# Patient Record
Sex: Male | Born: 1950
Health system: Southern US, Community
[De-identification: ages and names within clinical notes are randomized; demographics above are authoritative.]

## PROBLEM LIST (undated history)

## (undated) DIAGNOSIS — M4300 Spondylolysis, site unspecified: Secondary | ICD-10-CM

## (undated) DIAGNOSIS — I10 Essential (primary) hypertension: Secondary | ICD-10-CM

## (undated) DIAGNOSIS — C801 Malignant (primary) neoplasm, unspecified: Secondary | ICD-10-CM

## (undated) DIAGNOSIS — M1990 Unspecified osteoarthritis, unspecified site: Secondary | ICD-10-CM

## (undated) HISTORY — PX: COLONOSCOPY: SHX174

## (undated) HISTORY — PX: PROSTATECTOMY: SHX69

## (undated) HISTORY — PX: TONSILLECTOMY: SUR1361

---

## 1983-07-13 HISTORY — PX: KNEE ARTHROSCOPY: SUR90

## 2000-10-26 ENCOUNTER — Encounter: Payer: Self-pay | Admitting: Internal Medicine

## 2000-10-26 ENCOUNTER — Ambulatory Visit (HOSPITAL_COMMUNITY): Admission: RE | Admit: 2000-10-26 | Discharge: 2000-10-26 | Payer: Self-pay | Admitting: Internal Medicine

## 2001-01-31 ENCOUNTER — Ambulatory Visit (HOSPITAL_COMMUNITY): Admission: RE | Admit: 2001-01-31 | Discharge: 2001-01-31 | Payer: Self-pay | Admitting: *Deleted

## 2001-01-31 ENCOUNTER — Encounter: Payer: Self-pay | Admitting: *Deleted

## 2001-09-26 ENCOUNTER — Other Ambulatory Visit: Admission: RE | Admit: 2001-09-26 | Discharge: 2001-09-26 | Payer: Self-pay | Admitting: General Surgery

## 2003-04-03 ENCOUNTER — Encounter: Payer: Self-pay | Admitting: Family Medicine

## 2003-04-03 ENCOUNTER — Ambulatory Visit (HOSPITAL_COMMUNITY): Admission: RE | Admit: 2003-04-03 | Discharge: 2003-04-03 | Payer: Self-pay | Admitting: Family Medicine

## 2003-05-08 ENCOUNTER — Ambulatory Visit (HOSPITAL_COMMUNITY): Admission: RE | Admit: 2003-05-08 | Discharge: 2003-05-08 | Payer: Self-pay | Admitting: Internal Medicine

## 2004-12-08 ENCOUNTER — Ambulatory Visit (HOSPITAL_COMMUNITY): Admission: RE | Admit: 2004-12-08 | Discharge: 2004-12-08 | Payer: Self-pay | Admitting: Family Medicine

## 2005-01-20 ENCOUNTER — Ambulatory Visit (HOSPITAL_COMMUNITY): Admission: RE | Admit: 2005-01-20 | Discharge: 2005-01-20 | Payer: Self-pay | Admitting: Urology

## 2005-02-23 ENCOUNTER — Inpatient Hospital Stay (HOSPITAL_COMMUNITY): Admission: RE | Admit: 2005-02-23 | Discharge: 2005-02-26 | Payer: Self-pay | Admitting: Urology

## 2010-08-02 ENCOUNTER — Encounter: Payer: Self-pay | Admitting: Urology

## 2011-07-19 ENCOUNTER — Encounter (HOSPITAL_COMMUNITY): Payer: Self-pay | Admitting: Pharmacy Technician

## 2011-07-21 ENCOUNTER — Other Ambulatory Visit: Payer: Self-pay

## 2011-07-21 ENCOUNTER — Encounter (HOSPITAL_COMMUNITY)
Admission: RE | Admit: 2011-07-21 | Discharge: 2011-07-21 | Disposition: A | Discharge status: Home / Self Care | Payer: BC Managed Care – PPO | Source: Ambulatory Visit | Attending: Ophthalmology | Admitting: Ophthalmology

## 2011-07-21 ENCOUNTER — Encounter (HOSPITAL_COMMUNITY): Payer: Self-pay

## 2011-07-21 HISTORY — DX: Malignant (primary) neoplasm, unspecified: C80.1

## 2011-07-21 HISTORY — DX: Spondylolysis, site unspecified: M43.00

## 2011-07-21 HISTORY — DX: Essential (primary) hypertension: I10

## 2011-07-21 HISTORY — DX: Unspecified osteoarthritis, unspecified site: M19.90

## 2011-07-21 LAB — BASIC METABOLIC PANEL
BUN: 13 mg/dL (ref 6–23)
CO2: 28 mEq/L (ref 19–32)
Chloride: 105 mEq/L (ref 96–112)
GFR calc non Af Amer: >90 mL/min (ref >90)
Glucose, Bld: 90 mg/dL (ref 70–99)
Potassium: 4.1 mEq/L (ref 3.5–5.1)

## 2011-07-21 NOTE — Patient Instructions (Addendum)
20 ACEA YAGI  07/21/2011   Your procedure is scheduled on:   07/26/2011  Report to [facility] at  700  AM.  Call this number if you have problems the morning of surgery: [phone]   Remember:   Do not eat food:After Midnight.  May have clear liquids:until Midnight .  Clear liquids include soda, tea, black coffee, apple or grape juice, broth.  Take these medicines the morning of surgery with A SIP OF WATER: lorcet,lotrel   Do not wear jewelry, make-up or nail polish.  Do not wear lotions, powders, or perfumes. You may wear deodorant.  Do not shave 48 hours prior to surgery.  Do not bring valuables to the hospital.  Contacts, dentures or bridgework may not be worn into surgery.  Leave suitcase in the car. After surgery it may be brought to your room.  For patients admitted to the hospital, checkout time is 11:00 AM the day of discharge.   Patients discharged the day of surgery will not be allowed to drive home.  Name and phone number of your driver: family  Special Instructions: N/A   Please read over the following fact sheets that you were given: Pain Booklet, Surgical Site Infection Prevention, Anesthesia Post-op Instructions and Care and Recovery After Surgery Cataract A cataract is a clouding of the lens of the eye. It is most often related to aging. A cataract is not a "film" over the surface of the eye. The lens is inside the eye and changes size of the pupil. The lens can enlarge to let more light enter the eye in dark environments and contract the size of the pupil to let in bright light. The lens is the part of the eye that helps focus light on the retina. The retina is the eye's light-sensitive layer. It is in the back of the eye that sends visual signals to the brain. In a normal eye, light passes through the lens and gets focused on the retina. To help produce a sharp image, the lens must remain clear. When a lens becomes cloudy, vision is compromised by the degree and nature  of the clouding. Certain cataracts make people more near-sighted as they develop, others increase glare, and all reduce vision to some degree or another. A cataract that is so dense that it becomes milky white and a white opacity can be seen through the pupil. When the white color is seen, it is called a "mature" or "hyper-mature cataract." Such cataracts cause total blindness in the affected eye. The cataract must be removed to prevent damage to the eye itself. Some types of cataracts can cause a secondary disease of the eye, such as certain types of glaucoma. In the early stages, better lighting and eyeglasses may lessen vision problems caused by cataracts. At a certain point, surgery may be needed to improve vision. CAUSES   Aging. However, cataracts may occur at any age, even in newborns.   Certain drugs.   Trauma to the eye.   Certain diseases (such as diabetes).   Inherited or acquired medical syndromes.  SYMPTOMS   Gradual, progressive drop in vision in the affected eye. Cataracts may develop at different rates in each eye. Cataracts may even be in just one eye with the other unaffected.   Cataracts due to trauma may develop quickly, sometimes over a matter or days or even hours. The result is severe and rapid visual loss.  DIAGNOSIS  To detect a cataract, an eye doctor examines the  lens. A well developed cataract can be diagnosed without dilating the pupil. Early cataracts and others of a specific nature are best diagnosed with an exam of the eyes with the pupils dilated by drops. TREATMENT   For an early cataract, vision may improve by using different eyeglasses or stronger lighting.   If the above measures do not help, surgery is the only effective treatment. This treatment removes the cloudy lens and replaces it with a substitute lens (Intraocular lens, or IOL). Newly developed IOL technology allows the implanted lens to improve vision both at a distance and up close. Discuss with  your eye surgeon about the possibility of still needing glasses. Also discuss how visual coordination between both eyes will be affected.  A cataract needs to be removed only when vision loss interferes with your everyday activities such as driving, reading or watching TV. You and your eye doctor can make that decision together. In most cases, waiting until you are ready to have cataract surgery will not harm your eye. If you have cataracts in both eyes, only one should be removed at a time. This allows the operated eye to heal and be out of danger from serious problems (such as infection or poor wound healing) before having the other eye undergo surgery.  Sometimes, a cataract should be removed even if it does not cause problems with your vision. For example, a cataract should be removed if it prevents examination or treatment of another eye problem. Just as you cannot see out of the affected eye well, your doctor cannot see into your eye well through a cataract. The vast majority of people who have cataract surgery have better vision afterward. CATARACT REMOVAL There are two primary ways to remove a cataract. Your doctor can explain the differences and help determine which is best for you:  Phacoemulsification (small incision cataract surgery). This involves making a small cut (incision) on the edge of the clear, dome-shaped surface that covers the front of the eye (the cornea). An injection behind the eye or eye drops are given to make this a painless procedure. The doctor then inserts a tiny probe into the eye. This device emits ultrasound waves that soften and break up the cloudy center of the lens so it can be removed by suction. Most cataract surgery is done this way. The cuts are usually so small and performed in such a manner that often no sutures are needed to keep it closed.   Extracapsular surgery. Your doctor makes a slightly longer incision on the side of the cornea. The doctor removes the  hard center of the lens. The remainder of the lens is then removed by suction. In some cases, extremely fine sutures are needed which the doctor may, or may not remove in the office after the surgery.  When an IOL is implanted, it needs no care. It becomes a permanent part of your eye and cannot be seen or felt.  Some people cannot have an IOL. They may have problems during surgery, or maybe they have another eye disease. For these people, a soft contact lens may be suggested. If an IOL or contact lens cannot be used, very powerful and thick glasses are required after surgery. Since vision is very different through such thick glasses, it is important to have your doctor discuss the impact on your vision after any cataract surgery where there is no plan to implant an IOL. The normal lens of the eye is covered by a clear capsule.  Both phacoemulsification and extracapsular surgery require that the back surface of this lens capsule be left in place. This helps support IOLs and prevents the IOL from dislocating and falling back into the deeper interior of the eye. Right after surgery, and often permanently this "posterior capsule" remains clear. In some cases however, it can become cloudy, presenting the same type of visual compromise that the original cataract did since light is again obstructed as it passes through the clear IOL. This condition is often referred to as an "after-cataract." Fortunately, after-cataracts are easily treated using a painless and very fast laser treatment that is performed without anesthesia or incisions. It is done in a matter of minutes in an outpatient environment. Visual improvement is often immediate.  HOME CARE INSTRUCTIONS   Your surgeon will discuss pre and post operative care with you prior to surgery. The majority of people are able to do almost all normal activities right away. Although, it is often advised to avoid strenuous activity for a period of time.   Postoperative  drops and careful avoidance of infection will be needed. Many surgeons suggest the use of a protective shield during the first few days after surgery.   There is a very small incidence of complication from modern cataract surgery, but it can happen. Infection that spreads to the inside of the eye (endophthalmitis) can result in total visual loss and even loss of the eye itself. In extremely rare instances, the inflammation of endophthalmitis can spread to both eyes (sympathetic ophthalmia). Appropriate post-operative care under the close observation of your surgeon is essential to a successful outcome.  SEEK IMMEDIATE MEDICAL CARE IF:   You have any sudden drop of vision in the operated eye.   You have pain in the operated eye.   You see a large number of floating dots in the field of vision in the operated eye.   You see flashing lights, or if a portion of your side vision in any direction appears black (like a curtain being drawn into your field of vision) in the operated eye.  Document Released: 06/28/2005 Document Revised: 03/10/2011 Document Reviewed: 08/14/2007 [facility] Patient Information 2012 [place], [place].PATIENT INSTRUCTIONS POST-ANESTHESIA  IMMEDIATELY FOLLOWING SURGERY:  Do not drive or operate machinery for the first twenty four hours after surgery.  Do not make any important decisions for twenty four hours after surgery or while taking narcotic pain medications or sedatives.  If you develop intractable nausea and vomiting or a severe headache please notify your doctor immediately.  FOLLOW-UP:  Please make an appointment with your surgeon as instructed. You do not need to follow up with anesthesia unless specifically instructed to do so.  WOUND CARE INSTRUCTIONS (if applicable):  Keep a dry clean dressing on the anesthesia/puncture wound site if there is drainage.  Once the wound has quit draining you may leave it open to air.  Generally you should leave the bandage intact for  twenty four hours unless there is drainage.  If the epidural site drains for more than 36-48 hours please call the anesthesia department.  QUESTIONS?:  Please feel free to call your physician or the hospital operator if you have any questions, and they will be happy to assist you.     [facility] Anesthesia Department [address] [place] [phone]

## 2011-07-23 ENCOUNTER — Encounter (HOSPITAL_COMMUNITY): Payer: Self-pay | Admitting: *Deleted

## 2011-07-26 ENCOUNTER — Encounter (HOSPITAL_COMMUNITY): Payer: Self-pay | Admitting: *Deleted

## 2011-07-26 ENCOUNTER — Ambulatory Visit (HOSPITAL_COMMUNITY): Payer: BC Managed Care – PPO | Admitting: Anesthesiology

## 2011-07-26 ENCOUNTER — Encounter (HOSPITAL_COMMUNITY)
Admission: RE | Disposition: A | Discharge status: Home / Self Care | Payer: Self-pay | Source: Ambulatory Visit | Attending: Ophthalmology

## 2011-07-26 ENCOUNTER — Ambulatory Visit (HOSPITAL_COMMUNITY)
Admission: RE | Admit: 2011-07-26 | Discharge: 2011-07-26 | Disposition: A | Discharge status: Home / Self Care | Payer: BC Managed Care – PPO | Source: Ambulatory Visit | Attending: Ophthalmology | Admitting: Ophthalmology

## 2011-07-26 ENCOUNTER — Encounter (HOSPITAL_COMMUNITY): Payer: Self-pay | Admitting: Anesthesiology

## 2011-07-26 DIAGNOSIS — Z79899 Other long term (current) drug therapy: Secondary | ICD-10-CM | POA: Insufficient documentation

## 2011-07-26 DIAGNOSIS — Z01810 Encounter for preprocedural cardiovascular examination: Secondary | ICD-10-CM | POA: Insufficient documentation

## 2011-07-26 DIAGNOSIS — I10 Essential (primary) hypertension: Secondary | ICD-10-CM | POA: Insufficient documentation

## 2011-07-26 DIAGNOSIS — IMO0002 Reserved for concepts with insufficient information to code with codable children: Secondary | ICD-10-CM | POA: Insufficient documentation

## 2011-07-26 DIAGNOSIS — Z01812 Encounter for preprocedural laboratory examination: Secondary | ICD-10-CM | POA: Insufficient documentation

## 2011-07-26 HISTORY — PX: CATARACT EXTRACTION W/PHACO: SHX586

## 2011-07-26 SURGERY — PHACOEMULSIFICATION, CATARACT, WITH IOL INSERTION
Anesthesia: Monitor Anesthesia Care | Site: Eye | Laterality: Right | Wound class: Clean

## 2011-07-26 MED ORDER — PHENYLEPHRINE HCL 2.5 % OP SOLN
1.0000 [drp] | OPHTHALMIC | Status: AC
  Administered 2011-07-26 (×3): 1 [drp] via OPHTHALMIC

## 2011-07-26 MED ORDER — MIDAZOLAM HCL 2 MG/2ML IJ SOLN
1.0000 mg | INTRAMUSCULAR | Status: DC | PRN
  Administered 2011-07-26: 2 mg via INTRAVENOUS

## 2011-07-26 MED ORDER — EPINEPHRINE HCL 1 MG/ML IJ SOLN
INTRAOCULAR | Status: DC | PRN
  Administered 2011-07-26: 09:00:00

## 2011-07-26 MED ORDER — BSS IO SOLN
INTRAOCULAR | Status: DC | PRN
  Administered 2011-07-26: 15 mL via INTRAOCULAR

## 2011-07-26 MED ORDER — LIDOCAINE HCL (PF) 1 % IJ SOLN
INTRAMUSCULAR | Status: DC | PRN
  Administered 2011-07-26: .3 mL

## 2011-07-26 MED ORDER — EPINEPHRINE HCL 1 MG/ML IJ SOLN
INTRAMUSCULAR | Status: AC
  Filled 2011-07-26: qty 1

## 2011-07-26 MED ORDER — LIDOCAINE HCL (PF) 1 % IJ SOLN
INTRAMUSCULAR | Status: AC
  Filled 2011-07-26: qty 2

## 2011-07-26 MED ORDER — NEOMYCIN-POLYMYXIN-DEXAMETH 3.5-10000-0.1 OP OINT
TOPICAL_OINTMENT | OPHTHALMIC | Status: AC
  Filled 2011-07-26: qty 3.5

## 2011-07-26 MED ORDER — PROVISC 10 MG/ML IO SOLN
INTRAOCULAR | Status: DC | PRN
  Administered 2011-07-26: 8.5 mg via INTRAOCULAR

## 2011-07-26 MED ORDER — FENTANYL CITRATE 0.05 MG/ML IJ SOLN: 25.0000 ug | INTRAMUSCULAR | Status: DC | PRN

## 2011-07-26 MED ORDER — LIDOCAINE 3.5 % OP GEL OPTIME - NO CHARGE
OPHTHALMIC | Status: DC | PRN
  Administered 2011-07-26: 1 [drp] via OPHTHALMIC

## 2011-07-26 MED ORDER — MIDAZOLAM HCL 2 MG/2ML IJ SOLN
INTRAMUSCULAR | Status: AC
  Administered 2011-07-26: 2 mg via INTRAVENOUS
  Filled 2011-07-26: qty 2

## 2011-07-26 MED ORDER — CYCLOPENTOLATE-PHENYLEPHRINE 0.2-1 % OP SOLN
OPHTHALMIC | Status: AC
  Administered 2011-07-26: 1 [drp] via OPHTHALMIC
  Filled 2011-07-26: qty 2

## 2011-07-26 MED ORDER — CYCLOPENTOLATE-PHENYLEPHRINE 0.2-1 % OP SOLN
1.0000 [drp] | OPHTHALMIC | Status: AC
  Administered 2011-07-26 (×3): 1 [drp] via OPHTHALMIC

## 2011-07-26 MED ORDER — TETRACAINE HCL 0.5 % OP SOLN
OPHTHALMIC | Status: AC
  Administered 2011-07-26: 1 [drp] via OPHTHALMIC
  Filled 2011-07-26: qty 2

## 2011-07-26 MED ORDER — ONDANSETRON HCL 4 MG/2ML IJ SOLN: 4.0000 mg | Freq: Once | INTRAMUSCULAR | Status: DC | PRN

## 2011-07-26 MED ORDER — POVIDONE-IODINE 5 % OP SOLN
OPHTHALMIC | Status: DC | PRN
  Administered 2011-07-26: 1 via OPHTHALMIC

## 2011-07-26 MED ORDER — LIDOCAINE HCL 3.5 % OP GEL
OPHTHALMIC | Status: AC
  Administered 2011-07-26: 1 via OPHTHALMIC
  Filled 2011-07-26: qty 5

## 2011-07-26 MED ORDER — LIDOCAINE HCL 3.5 % OP GEL
1.0000 "application " | Freq: Once | OPHTHALMIC | Status: AC
  Administered 2011-07-26: 1 via OPHTHALMIC

## 2011-07-26 MED ORDER — TETRACAINE HCL 0.5 % OP SOLN
1.0000 [drp] | OPHTHALMIC | Status: AC
  Administered 2011-07-26 (×3): 1 [drp] via OPHTHALMIC

## 2011-07-26 MED ORDER — NEOMYCIN-POLYMYXIN-DEXAMETH 0.1 % OP OINT
TOPICAL_OINTMENT | OPHTHALMIC | Status: DC | PRN
  Administered 2011-07-26: 1 via OPHTHALMIC

## 2011-07-26 MED ORDER — LACTATED RINGERS IV SOLN
INTRAVENOUS | Status: DC
  Administered 2011-07-26: 1000 mL via INTRAVENOUS

## 2011-07-26 MED ORDER — PHENYLEPHRINE HCL 2.5 % OP SOLN
OPHTHALMIC | Status: AC
  Administered 2011-07-26: 1 [drp] via OPHTHALMIC
  Filled 2011-07-26: qty 2

## 2011-07-26 SURGICAL SUPPLY — 30 items
CAPSULAR TENSION RING-AMO (OPHTHALMIC RELATED) IMPLANT
CLOTH BEACON ORANGE TIMEOUT ST (SAFETY) ×2 IMPLANT
EYE SHIELD UNIVERSAL CLEAR (GAUZE/BANDAGES/DRESSINGS) ×2 IMPLANT
GLOVE BIO SURGEON STRL SZ 6.5 (GLOVE) IMPLANT
GLOVE BIOGEL PI IND STRL 6.5 (GLOVE) ×1 IMPLANT
GLOVE BIOGEL PI IND STRL 7.0 (GLOVE) IMPLANT
GLOVE BIOGEL PI IND STRL 7.5 (GLOVE) IMPLANT
GLOVE BIOGEL PI INDICATOR 6.5 (GLOVE) ×1
GLOVE BIOGEL PI INDICATOR 7.0 (GLOVE)
GLOVE BIOGEL PI INDICATOR 7.5 (GLOVE)
GLOVE ECLIPSE 6.5 STRL STRAW (GLOVE) IMPLANT
GLOVE ECLIPSE 7.0 STRL STRAW (GLOVE) IMPLANT
GLOVE ECLIPSE 7.5 STRL STRAW (GLOVE) IMPLANT
GLOVE EXAM NITRILE LRG STRL (GLOVE) IMPLANT
GLOVE EXAM NITRILE MD LF STRL (GLOVE) ×2 IMPLANT
GLOVE SKINSENSE NS SZ6.5 (GLOVE)
GLOVE SKINSENSE NS SZ7.0 (GLOVE)
GLOVE SKINSENSE STRL SZ6.5 (GLOVE) IMPLANT
GLOVE SKINSENSE STRL SZ7.0 (GLOVE) IMPLANT
KIT VITRECTOMY (OPHTHALMIC RELATED) IMPLANT
PAD ARMBOARD 7.5X6 YLW CONV (MISCELLANEOUS) ×2 IMPLANT
PROC W NO LENS (INTRAOCULAR LENS)
PROC W SPEC LENS (INTRAOCULAR LENS)
PROCESS W NO LENS (INTRAOCULAR LENS) IMPLANT
PROCESS W SPEC LENS (INTRAOCULAR LENS) IMPLANT
RING MALYGIN (MISCELLANEOUS) IMPLANT
SIGHTPATH CAT PROC W REG LENS (Ophthalmic Related) ×2 IMPLANT
SYR TB 1ML LL NO SAFETY (SYRINGE) ×2 IMPLANT
VISCOELASTIC ADDITIONAL (OPHTHALMIC RELATED) IMPLANT
WATER STERILE IRR 250ML POUR (IV SOLUTION) ×2 IMPLANT

## 2011-07-26 NOTE — H&P (Signed)
I have reviewed the H&P, the patient was re-examined, and I have identified no interval changes in medical condition and plan of care since the history and physical of record  

## 2011-07-26 NOTE — Anesthesia Postprocedure Evaluation (Signed)
  Anesthesia Post-op Note  Patient: Angel Lewis  Procedure(s) Performed:  CATARACT EXTRACTION PHACO AND INTRAOCULAR LENS PLACEMENT (IOC) - CDE:10.88  Patient Location: PACU and Short Stay  Anesthesia Type: MAC  Level of Consciousness: awake, alert , oriented and patient cooperative  Airway and Oxygen Therapy: Patient Spontanous Breathing  Post-op Pain: none  Post-op Assessment: Post-op Vital signs reviewed, Patient's Cardiovascular Status Stable, Respiratory Function Stable, Patent Airway and No signs of Nausea or vomiting  Post-op Vital Signs: Reviewed and stable  Complications: No apparent anesthesia complications

## 2011-07-26 NOTE — Brief Op Note (Signed)
Pre-Op Dx: Cataract OD Post-Op Dx: Cataract OD Surgeon: Corayma Cashatt Anesthesia: Topical with MAC Implant: B&L enVista Blood Loss: None Specimen: None Complications: None 

## 2011-07-26 NOTE — Anesthesia Preprocedure Evaluation (Signed)
Anesthesia Evaluation  Patient identified by MRN, date of birth, ID band Patient awake    Reviewed: Allergy & Precautions, H&P , NPO status , Patient's Chart, lab work & pertinent test results  History of Anesthesia Complications Negative for: history of anesthetic complications  Airway Mallampati: II      Dental  (+) Teeth Intact   Pulmonary Current Smoker,  + rhonchi        Cardiovascular hypertension, Pt. on medications Regular Normal    Neuro/Psych    GI/Hepatic   Endo/Other    Renal/GU      Musculoskeletal  (+) Arthritis - (ankylosing spondylitis),   Abdominal   Peds  Hematology   Anesthesia Other Findings   Reproductive/Obstetrics                           Anesthesia Physical Anesthesia Plan  ASA: III  Anesthesia Plan: MAC   Post-op Pain Management:    Induction: Intravenous  Airway Management Planned: Nasal Cannula  Additional Equipment:   Intra-op Plan:   Post-operative Plan:   Informed Consent: I have reviewed the patients History and Physical, chart, labs and discussed the procedure including the risks, benefits and alternatives for the proposed anesthesia with the patient or authorized representative who has indicated his/her understanding and acceptance.     Plan Discussed with:   Anesthesia Plan Comments:         Anesthesia Quick Evaluation

## 2011-07-26 NOTE — Transfer of Care (Signed)
Immediate Anesthesia Transfer of Care Note  Patient: Angel Lewis  Procedure(s) Performed:  CATARACT EXTRACTION PHACO AND INTRAOCULAR LENS PLACEMENT (IOC) - CDE:10.88  Patient Location: PACU and Short Stay  Anesthesia Type: MAC  Level of Consciousness: awake, alert , oriented and patient cooperative  Airway & Oxygen Therapy: Patient Spontanous Breathing  Post-op Assessment: Report given to PACU RN, Post -op Vital signs reviewed and stable and Patient moving all extremities X 4  Post vital signs: Reviewed and stable Filed Vitals:   07/26/11 0825  BP: 121/75  Pulse:   Temp:   Resp: 18    Complications: No apparent anesthesia complications

## 2011-07-27 ENCOUNTER — Encounter (HOSPITAL_COMMUNITY): Payer: Self-pay | Admitting: Ophthalmology

## 2011-07-27 NOTE — Op Note (Signed)
NAME:  Angel Lewis, Angel Lewis NO.:  [id number]  MEDICAL RECORD NO.:  [id number]  LOCATION:  [facility]                          FACILITY:  [facility]  PHYSICIAN:  Susanne Greenhouse, MD       DATE OF BIRTH:  1951/07/10  DATE OF PROCEDURE:  07/26/2011 DATE OF DISCHARGE:  07/26/2011                              OPERATIVE REPORT   PREOPERATIVE DIAGNOSIS:  Posterior subcapsular cataract, right eye, diagnosis code 366.02.  POSTOPERATIVE DIAGNOSIS:  Posterior subcapsular cataract, right eye 366.02.  __________.  No surgical specimens.  Prosthetic device used is a Cabin crew posterior chamber lens, model MX60, power of 19.5, serial number is [id number].          ______________________________ Susanne Greenhouse, MD     KEH/MEDQ  D:  07/26/2011  T:  07/27/2011  Job:  [id number]

## 2014-09-25 ENCOUNTER — Telehealth: Payer: Self-pay

## 2014-09-25 NOTE — Telephone Encounter (Signed)
PATIENT WIFE DONNA CALLED TO SCHEDULE HIS COLONOSCOPY, HE RECEIVED A LETTER 05/2014.  [phone] HOME, [phone] CELL

## 2014-10-07 ENCOUNTER — Other Ambulatory Visit: Payer: Self-pay

## 2014-10-07 ENCOUNTER — Telehealth: Payer: Self-pay

## 2014-10-07 DIAGNOSIS — Z1211 Encounter for screening for malignant neoplasm of colon: Secondary | ICD-10-CM

## 2014-10-07 NOTE — Telephone Encounter (Signed)
Gastroenterology Pre-Procedure Review  Request Date: 10/07/2014 Requesting Physician: Dr.Fusco  PATIENT REVIEW QUESTIONS: The patient responded to the following health history questions as indicated:    1. Diabetes Melitis: no 2. Joint replacements in the past 12 months: no 3. Major health problems in the past 3 months: no 4. Has an artificial valve or MVP: no 5. Has a defibrillator: no 6. Has been advised in past to take antibiotics in advance of a procedure like teeth cleaning: no    MEDICATIONS & ALLERGIES:    Patient reports the following regarding taking any blood thinners:   Plavix? no Aspirin? 81 mg  Coumadin?  Patient confirms/reports the following medications:  Current Outpatient Prescriptions  Medication Sig Dispense Refill  . amLODipine-benazepril (LOTREL) 10-40 MG per capsule Take 1 capsule by mouth daily.    Marland Kitchen aspirin 81 MG tablet Take 81 mg by mouth daily.    . cholecalciferol (VITAMIN D) 1000 UNITS tablet Take 1,000 Units by mouth daily.      . citalopram (CELEXA) 10 MG tablet Take 10 mg by mouth daily.    . Coenzyme Q10 (CO Q-10) 100 MG CAPS Take 1 capsule by mouth daily.     . Garlic 9311 MG CAPS Take 1 capsule by mouth daily.     Marland Kitchen HYDROcodone-acetaminophen (LORCET) 10-650 MG per tablet Take 1 tablet by mouth every 6 (six) hours as needed. For pain      . Milk Thistle 250 MG CAPS Take 2-3 capsules by mouth daily.     . mometasone (NASONEX) 50 MCG/ACT nasal spray Place 2 sprays into the nose daily as needed. For congestion     . NON FORMULARY Magnesium     One tablet daily    . Omega-3 Fatty Acids (FISH OIL) 1000 MG CAPS Take by mouth daily.     No current facility-administered medications for this visit.    Patient confirms/reports the following allergies:  Allergies  Allergen Reactions  . Percodan [Oxycodone-Aspirin] Itching    No orders of the defined types were placed in this encounter.    AUTHORIZATION INFORMATION Primary Insurance:  ID #:  Group  #:  Pre-Cert / Auth required:  Pre-Cert / Auth #:   Secondary Insurance:   ID #:   Group # Pre-Cert / Auth required: Pre-Cert / Auth #:   SCHEDULE INFORMATION: Procedure has been scheduled as follows:  Date:  11/04/2014                Time: 7:30 Am Location: [facility] Short Stay  This Gastroenterology Pre-Precedure Review Form is being routed to the following provider(s): R. Garfield Cornea, MD

## 2014-10-08 NOTE — Telephone Encounter (Signed)
See separate triage.  

## 2014-10-08 NOTE — Telephone Encounter (Signed)
Appropriate.

## 2014-10-09 MED ORDER — PEG-KCL-NACL-NASULF-NA ASC-C 100 G PO SOLR: 1.0000 | ORAL | Status: DC

## 2014-10-09 NOTE — Telephone Encounter (Signed)
Rx sent to the pharmacy and instructions mailed to pt.  

## 2014-10-14 NOTE — Telephone Encounter (Signed)
Received medical records operative report:  Pt had last colonoscopy by Dr. Gala Romney on 05/08/2003.

## 2014-10-15 ENCOUNTER — Telehealth: Payer: Self-pay

## 2014-10-15 NOTE — Telephone Encounter (Signed)
T/C from [place] at the [facility]. The pt's insurance has changed. He has a Best boy. She said it does not require PA for screening colonoscopy.  Reference # [id number] She put the new information in the computer.

## 2014-11-04 ENCOUNTER — Ambulatory Visit (HOSPITAL_COMMUNITY)
Admission: RE | Admit: 2014-11-04 | Discharge: 2014-11-04 | Disposition: A | Discharge status: Home / Self Care | Payer: 59 | Source: Ambulatory Visit | Attending: Internal Medicine | Admitting: Internal Medicine

## 2014-11-04 ENCOUNTER — Encounter (HOSPITAL_COMMUNITY): Payer: Self-pay | Admitting: *Deleted

## 2014-11-04 ENCOUNTER — Encounter (HOSPITAL_COMMUNITY)
Admission: RE | Disposition: A | Discharge status: Home / Self Care | Payer: Self-pay | Source: Ambulatory Visit | Attending: Internal Medicine

## 2014-11-04 DIAGNOSIS — M1990 Unspecified osteoarthritis, unspecified site: Secondary | ICD-10-CM | POA: Diagnosis not present

## 2014-11-04 DIAGNOSIS — M4300 Spondylolysis, site unspecified: Secondary | ICD-10-CM | POA: Insufficient documentation

## 2014-11-04 DIAGNOSIS — D125 Benign neoplasm of sigmoid colon: Secondary | ICD-10-CM | POA: Diagnosis not present

## 2014-11-04 DIAGNOSIS — Z79891 Long term (current) use of opiate analgesic: Secondary | ICD-10-CM | POA: Insufficient documentation

## 2014-11-04 DIAGNOSIS — D124 Benign neoplasm of descending colon: Secondary | ICD-10-CM | POA: Insufficient documentation

## 2014-11-04 DIAGNOSIS — Z79899 Other long term (current) drug therapy: Secondary | ICD-10-CM | POA: Diagnosis not present

## 2014-11-04 DIAGNOSIS — Z1211 Encounter for screening for malignant neoplasm of colon: Secondary | ICD-10-CM

## 2014-11-04 DIAGNOSIS — Z7982 Long term (current) use of aspirin: Secondary | ICD-10-CM | POA: Insufficient documentation

## 2014-11-04 DIAGNOSIS — Z8546 Personal history of malignant neoplasm of prostate: Secondary | ICD-10-CM | POA: Diagnosis not present

## 2014-11-04 DIAGNOSIS — Z7951 Long term (current) use of inhaled steroids: Secondary | ICD-10-CM | POA: Diagnosis not present

## 2014-11-04 DIAGNOSIS — I10 Essential (primary) hypertension: Secondary | ICD-10-CM | POA: Insufficient documentation

## 2014-11-04 DIAGNOSIS — Z860100 Personal history of colon polyps, unspecified: Secondary | ICD-10-CM | POA: Insufficient documentation

## 2014-11-04 DIAGNOSIS — K5730 Diverticulosis of large intestine without perforation or abscess without bleeding: Secondary | ICD-10-CM | POA: Insufficient documentation

## 2014-11-04 DIAGNOSIS — F17210 Nicotine dependence, cigarettes, uncomplicated: Secondary | ICD-10-CM | POA: Diagnosis not present

## 2014-11-04 HISTORY — PX: COLONOSCOPY: SHX5424

## 2014-11-04 SURGERY — COLONOSCOPY
Anesthesia: Moderate Sedation

## 2014-11-04 MED ORDER — ONDANSETRON HCL 4 MG/2ML IJ SOLN
INTRAMUSCULAR | Status: AC
  Filled 2014-11-04: qty 2

## 2014-11-04 MED ORDER — SODIUM CHLORIDE 0.9 % IV SOLN
INTRAVENOUS | Status: DC
  Administered 2014-11-04: 07:00:00 via INTRAVENOUS

## 2014-11-04 MED ORDER — MEPERIDINE HCL 100 MG/ML IJ SOLN
INTRAMUSCULAR | Status: AC
  Filled 2014-11-04: qty 2

## 2014-11-04 MED ORDER — MIDAZOLAM HCL 5 MG/5ML IJ SOLN
INTRAMUSCULAR | Status: AC
  Filled 2014-11-04: qty 10

## 2014-11-04 MED ORDER — STERILE WATER FOR IRRIGATION IR SOLN
Status: DC | PRN
  Administered 2014-11-04: 08:00:00

## 2014-11-04 MED ORDER — MIDAZOLAM HCL 5 MG/5ML IJ SOLN
INTRAMUSCULAR | Status: DC | PRN
  Administered 2014-11-04: 1 mg via INTRAVENOUS
  Administered 2014-11-04: 2 mg via INTRAVENOUS
  Administered 2014-11-04 (×2): 1 mg via INTRAVENOUS

## 2014-11-04 MED ORDER — ONDANSETRON HCL 4 MG/2ML IJ SOLN
INTRAMUSCULAR | Status: DC | PRN
  Administered 2014-11-04: 4 mg via INTRAVENOUS

## 2014-11-04 MED ORDER — MEPERIDINE HCL 100 MG/ML IJ SOLN
INTRAMUSCULAR | Status: DC | PRN
  Administered 2014-11-04: 50 mg via INTRAVENOUS
  Administered 2014-11-04: 25 mg via INTRAVENOUS

## 2014-11-04 NOTE — H&P (Signed)
@LOGO @   Primary Care Physician:  Leonides Grills, MD Primary Gastroenterologist:  Dr. Gala Romney  Pre-Procedure History & Physical: HPI:  Angel Lewis is a 64 y.o. male is here for a screening colonoscopy. Last colonoscopy about 11 years ago-reportedly negative. Records unavailable. No bowel symptoms. No family history of colon cancer.  Past Medical History  Diagnosis Date  . Spondylolysis     all over   . Hypertension   . Arthritis   . Cancer     prostate    Past Surgical History  Procedure Laterality Date  . Prostatectomy  02/23/2005 [name]    radical prostatectomy  . Knee arthroscopy  1985    left  . Tonsillectomy      as child  . Cataract extraction w/phaco  07/26/2011    Procedure: CATARACT EXTRACTION PHACO AND INTRAOCULAR LENS PLACEMENT (IOC);  Surgeon: Tonny Branch;  Location: [facility];  Service: Ophthalmology;  Laterality: Right;  CDE:10.88  . Colonoscopy      Prior to Admission medications   Medication Sig Start Date End Date Taking? Authorizing Provider  ALPRAZolam Duanne Moron) 0.5 MG tablet Take 0.5 mg by mouth at bedtime as needed for anxiety.   Yes Historical Provider, MD  amLODipine-benazepril (LOTREL) 10-40 MG per capsule Take 1 capsule by mouth daily.   Yes Historical Provider, MD  aspirin 81 MG tablet Take 162 mg by mouth at bedtime.    Yes Historical Provider, MD  cholecalciferol (VITAMIN D) 1000 UNITS tablet Take 1,000 Units by mouth daily.     Yes Historical Provider, MD  citalopram (CELEXA) 10 MG tablet Take 10 mg by mouth daily.   Yes Historical Provider, MD  Coenzyme Q10 (CO Q-10) 100 MG CAPS Take 1 capsule by mouth daily.    Yes Historical Provider, MD  Garlic 9030 MG CAPS Take 1 capsule by mouth daily.    Yes Historical Provider, MD  HYDROcodone-acetaminophen (LORCET) 10-650 MG per tablet Take 1 tablet by mouth every 6 (six) hours as needed for pain. For pain    Yes Historical Provider, MD  MAGNESIUM PO Take 1 tablet by mouth daily.   Yes Historical  Provider, MD  Milk Thistle 250 MG CAPS Take 2-3 capsules by mouth daily.    Yes Historical Provider, MD  mometasone (NASONEX) 50 MCG/ACT nasal spray Place 2 sprays into the nose daily as needed. For congestion    Yes Historical Provider, MD  peg 3350 powder (MOVIPREP) 100 G SOLR Take 1 kit (200 g total) by mouth as directed. 10/09/14  Yes Daneil Dolin, MD    Allergies as of 10/07/2014 - Review Complete 10/07/2014  Allergen Reaction Noted  . Percodan [oxycodone-aspirin] Itching 07/19/2011    Family History  Problem Relation Age of Onset  . Anesthesia problems Neg Hx   . Hypotension Neg Hx   . Malignant hyperthermia Neg Hx   . Pseudochol deficiency Neg Hx     History   Social History  . Marital Status: Married    Spouse Name: N/A  . Number of Children: N/A  . Years of Education: N/A   Occupational History  . Not on file.   Social History Main Topics  . Smoking status: Current Every Day Smoker -- 0.25 packs/day for 30 years    Types: Cigarettes  . Smokeless tobacco: Not on file  . Alcohol Use: Yes     Comment: rarely  . Drug Use: No  . Sexual Activity: Yes    Birth Control/ Protection: None  Other Topics Concern  . Not on file   Social History Narrative    Review of Systems: See HPI, otherwise negative ROS  Physical Exam: BP 165/76 mmHg  Pulse 56  Temp(Src) 97.6 F (36.4 C) (Oral)  Resp 20  Ht 5' 7.5" (1.715 m)  Wt 186 lb (84.369 kg)  BMI 28.68 kg/m2  SpO2 97% General:   Alert,  Well-developed, well-nourished, pleasant and cooperative in NAD Head:  Normocephalic and atraumatic. Eyes:  Sclera clear, no icterus.   Conjunctiva pink. Ears:  Normal auditory acuity. Nose:  No deformity, discharge,  or lesions. Mouth:  No deformity or lesions, dentition normal. Neck:  Supple; no masses or thyromegaly. Lungs:  Clear throughout to auscultation.   No wheezes, crackles, or rhonchi. No acute distress. Heart:  Regular rate and rhythm; no murmurs, clicks, rubs,  or  gallops. Abdomen:  Soft, nontender and nondistended. No masses, hepatosplenomegaly or hernias noted. Normal bowel sounds, without guarding, and without rebound.   Msk:  Symmetrical without gross deformities. Normal posture. Pulses:  Normal pulses noted. Extremities:  Without clubbing or edema. Neurologic:  Alert and  oriented x4;  grossly normal neurologically. Skin:  Intact without significant lesions or rashes. Cervical Nodes:  No significant cervical adenopathy. Psych:  Alert and cooperative. Normal mood and affect.  Impression/Plan: Angel Lewis is now here to undergo a screening colonoscopy.  Average risk screening examination Risks, benefits, limitations, imponderables and alternatives regarding colonoscopy have been reviewed with the patient. Questions have been answered. All parties agreeable.     Notice:  This dictation was prepared with Dragon dictation along with smaller phrase technology. Any transcriptional errors that result from this process are unintentional and may not be corrected upon review.

## 2014-11-04 NOTE — Op Note (Signed)
[facility] [address] [place], [address]   COLONOSCOPY PROCEDURE REPORT  PATIENT: Angel Lewis, Angel Lewis  MR#: [id number] BIRTHDATE: 1951/02/27 , 64  yrs. old GENDER: male ENDOSCOPIST: R.  Garfield Cornea, MD FACP [facility] REFERRED SW:NIOEV Gerarda Fraction, M.D. PROCEDURE DATE:  12-03-14 PROCEDURE:   Colonoscopy with snare polypectomy INDICATIONS:Average risk colorectal cancer screening evaluation. MEDICATIONS: Versed 5 mg IV and Demerol 75 mg IV in divided doses. Zofran 4 mg IV. ASA CLASS:       Class II  CONSENT: The risks, benefits, alternatives and imponderables including but not limited to bleeding, perforation as well as the possibility of a missed lesion have been reviewed.  The potential for biopsy, lesion removal, etc. have also been discussed. Questions have been answered.  All parties agreeable.  Please see the history and physical in the medical record for more information.  DESCRIPTION OF PROCEDURE:   After the risks benefits and alternatives of the procedure were thoroughly explained, informed consent was obtained.  The digital rectal exam revealed no abnormalities of the rectum.   The EC-[id number] ([id number])  endoscope was introduced through the anus and advanced to the cecum, which was identified by both the appendix and ileocecal valve. No adverse events experienced.   The quality of the prep was adequate  The instrument was then slowly withdrawn as the colon was fully examined.      COLON FINDINGS: Normal-appearing rectal mucosa.  Scattered pancolonic diverticulosis.  (1) 6 mm pedunculated polyp in the mid sigmoid segment and a second similar size polyp in the mid descending segment; otherwise, the remainder of the colonic mucosa appeared normal.  The above-mentioned polyps were cold snare removed.  Retroflexed views revealed no abnormalities. .  Withdrawal time=11 minutes 0 seconds.  The scope was withdrawn and the procedure completed. COMPLICATIONS:  There were no immediate complications.  ENDOSCOPIC IMPRESSION: Multiple colonic polyps?"removed as described above. Pancolonic diverticulosis.  RECOMMENDATIONS: follow-up on pathology.  eSigned:  R. Garfield Cornea, MD [facility] 2014-12-03 8:20 AM   cc:  CPT CODES: ICD CODES:  The ICD and CPT codes recommended by this software are interpretations from the data that the clinical staff has captured with the software.  The verification of the translation of this report to the ICD and CPT codes and modifiers is the sole responsibility of the health care institution and practicing physician where this report was generated.  [employer] will not be held responsible for the validity of the ICD and CPT codes included on this report.  AMA assumes no liability for data contained or not contained herein. CPT is a Designer, television/film set of the [employer].  PATIENT NAME:  Angel Lewis, Angel Lewis MR#: [id number]

## 2014-11-04 NOTE — Discharge Instructions (Signed)

## 2014-11-05 ENCOUNTER — Encounter (HOSPITAL_COMMUNITY): Payer: Self-pay | Admitting: Internal Medicine

## 2014-11-05 ENCOUNTER — Encounter: Payer: Self-pay | Admitting: Internal Medicine

## 2015-07-21 DIAGNOSIS — Z6828 Body mass index (BMI) 28.0-28.9, adult: Secondary | ICD-10-CM | POA: Diagnosis not present

## 2015-07-21 DIAGNOSIS — F419 Anxiety disorder, unspecified: Secondary | ICD-10-CM | POA: Diagnosis not present

## 2015-07-21 DIAGNOSIS — F329 Major depressive disorder, single episode, unspecified: Secondary | ICD-10-CM | POA: Diagnosis not present

## 2015-07-21 DIAGNOSIS — I10 Essential (primary) hypertension: Secondary | ICD-10-CM | POA: Diagnosis not present

## 2015-07-21 DIAGNOSIS — Z1389 Encounter for screening for other disorder: Secondary | ICD-10-CM | POA: Diagnosis not present

## 2015-07-21 DIAGNOSIS — E663 Overweight: Secondary | ICD-10-CM | POA: Diagnosis not present

## 2015-07-21 DIAGNOSIS — Z23 Encounter for immunization: Secondary | ICD-10-CM | POA: Diagnosis not present

## 2015-09-29 DIAGNOSIS — E663 Overweight: Secondary | ICD-10-CM | POA: Diagnosis not present

## 2015-09-29 DIAGNOSIS — Z1389 Encounter for screening for other disorder: Secondary | ICD-10-CM | POA: Diagnosis not present

## 2015-09-29 DIAGNOSIS — G894 Chronic pain syndrome: Secondary | ICD-10-CM | POA: Diagnosis not present

## 2015-09-29 DIAGNOSIS — Z6829 Body mass index (BMI) 29.0-29.9, adult: Secondary | ICD-10-CM | POA: Diagnosis not present

## 2015-10-09 DIAGNOSIS — H26491 Other secondary cataract, right eye: Secondary | ICD-10-CM | POA: Diagnosis not present

## 2015-10-09 DIAGNOSIS — Z961 Presence of intraocular lens: Secondary | ICD-10-CM | POA: Diagnosis not present

## 2015-10-09 DIAGNOSIS — H524 Presbyopia: Secondary | ICD-10-CM | POA: Diagnosis not present

## 2015-10-09 DIAGNOSIS — H25042 Posterior subcapsular polar age-related cataract, left eye: Secondary | ICD-10-CM | POA: Diagnosis not present

## 2015-10-13 DIAGNOSIS — Z0001 Encounter for general adult medical examination with abnormal findings: Secondary | ICD-10-CM | POA: Diagnosis not present

## 2015-10-13 DIAGNOSIS — Z6829 Body mass index (BMI) 29.0-29.9, adult: Secondary | ICD-10-CM | POA: Diagnosis not present

## 2015-10-13 DIAGNOSIS — Z1389 Encounter for screening for other disorder: Secondary | ICD-10-CM | POA: Diagnosis not present

## 2015-10-13 DIAGNOSIS — M455 Ankylosing spondylitis of thoracolumbar region: Secondary | ICD-10-CM | POA: Diagnosis not present

## 2015-10-13 DIAGNOSIS — E663 Overweight: Secondary | ICD-10-CM | POA: Diagnosis not present

## 2015-12-16 DIAGNOSIS — Z6829 Body mass index (BMI) 29.0-29.9, adult: Secondary | ICD-10-CM | POA: Diagnosis not present

## 2015-12-16 DIAGNOSIS — Z1389 Encounter for screening for other disorder: Secondary | ICD-10-CM | POA: Diagnosis not present

## 2015-12-16 DIAGNOSIS — I10 Essential (primary) hypertension: Secondary | ICD-10-CM | POA: Diagnosis not present

## 2015-12-16 DIAGNOSIS — M459 Ankylosing spondylitis of unspecified sites in spine: Secondary | ICD-10-CM | POA: Diagnosis not present

## 2016-02-13 DIAGNOSIS — Z8546 Personal history of malignant neoplasm of prostate: Secondary | ICD-10-CM | POA: Diagnosis not present

## 2016-02-13 DIAGNOSIS — N5231 Erectile dysfunction following radical prostatectomy: Secondary | ICD-10-CM | POA: Diagnosis not present

## 2016-03-17 DIAGNOSIS — M459 Ankylosing spondylitis of unspecified sites in spine: Secondary | ICD-10-CM | POA: Diagnosis not present

## 2016-03-17 DIAGNOSIS — Z6830 Body mass index (BMI) 30.0-30.9, adult: Secondary | ICD-10-CM | POA: Diagnosis not present

## 2016-03-17 DIAGNOSIS — Z1389 Encounter for screening for other disorder: Secondary | ICD-10-CM | POA: Diagnosis not present

## 2016-03-17 DIAGNOSIS — Z79891 Long term (current) use of opiate analgesic: Secondary | ICD-10-CM | POA: Diagnosis not present

## 2016-03-17 DIAGNOSIS — G894 Chronic pain syndrome: Secondary | ICD-10-CM | POA: Diagnosis not present

## 2016-03-17 DIAGNOSIS — G4700 Insomnia, unspecified: Secondary | ICD-10-CM | POA: Diagnosis not present

## 2016-04-19 ENCOUNTER — Encounter (HOSPITAL_COMMUNITY): Payer: Self-pay | Admitting: Emergency Medicine

## 2016-04-19 ENCOUNTER — Emergency Department (HOSPITAL_COMMUNITY)
Admission: EM | Admit: 2016-04-19 | Discharge: 2016-04-19 | Disposition: A | Discharge status: Home / Self Care | Payer: PPO | Attending: Emergency Medicine | Admitting: Emergency Medicine

## 2016-04-19 DIAGNOSIS — R197 Diarrhea, unspecified: Secondary | ICD-10-CM | POA: Insufficient documentation

## 2016-04-19 DIAGNOSIS — Z79899 Other long term (current) drug therapy: Secondary | ICD-10-CM | POA: Insufficient documentation

## 2016-04-19 DIAGNOSIS — F1120 Opioid dependence, uncomplicated: Secondary | ICD-10-CM | POA: Insufficient documentation

## 2016-04-19 DIAGNOSIS — Z7982 Long term (current) use of aspirin: Secondary | ICD-10-CM | POA: Diagnosis not present

## 2016-04-19 DIAGNOSIS — F1910 Other psychoactive substance abuse, uncomplicated: Secondary | ICD-10-CM | POA: Diagnosis not present

## 2016-04-19 DIAGNOSIS — Z8546 Personal history of malignant neoplasm of prostate: Secondary | ICD-10-CM | POA: Diagnosis not present

## 2016-04-19 DIAGNOSIS — F17210 Nicotine dependence, cigarettes, uncomplicated: Secondary | ICD-10-CM | POA: Insufficient documentation

## 2016-04-19 DIAGNOSIS — I10 Essential (primary) hypertension: Secondary | ICD-10-CM | POA: Diagnosis not present

## 2016-04-19 LAB — COMPREHENSIVE METABOLIC PANEL
ALT: 15 U/L — ABNORMAL LOW (ref 17–63)
ANION GAP: 7 (ref 5–15)
AST: 20 U/L (ref 15–41)
Albumin: 4.5 g/dL (ref 3.5–5.0)
Alkaline Phosphatase: 48 U/L (ref 38–126)
BUN: 10 mg/dL (ref 6–20)
CALCIUM: 9.6 mg/dL (ref 8.9–10.3)
CHLORIDE: 106 mmol/L (ref 101–111)
CO2: 26 mmol/L (ref 22–32)
Creatinine, Ser: 0.61 mg/dL (ref 0.61–1.24)
GFR calc non Af Amer: >60 mL/min (ref >60)
Glucose, Bld: 110 mg/dL — ABNORMAL HIGH (ref 65–99)
Potassium: 4.2 mmol/L (ref 3.5–5.1)
Sodium: 139 mmol/L (ref 135–145)
Total Bilirubin: 0.9 mg/dL (ref 0.3–1.2)
Total Protein: 7.6 g/dL (ref 6.5–8.1)

## 2016-04-19 LAB — RAPID URINE DRUG SCREEN, HOSP PERFORMED
AMPHETAMINES: NOT DETECTED
BENZODIAZEPINES: NOT DETECTED
Barbiturates: NOT DETECTED
Cocaine: NOT DETECTED
Opiates: NOT DETECTED
Tetrahydrocannabinol: NOT DETECTED

## 2016-04-19 LAB — CBC
HEMATOCRIT: 46.2 % (ref 39.0–52.0)
Hemoglobin: 15.7 g/dL (ref 13.0–17.0)
MCH: 31.3 pg (ref 26.0–34.0)
MCHC: 34 g/dL (ref 30.0–36.0)
MCV: 92 fL (ref 78.0–100.0)
PLATELETS: 337 10*3/uL (ref 150–400)
RBC: 5.02 MIL/uL (ref 4.22–5.81)
RDW: 13.7 % (ref 11.5–15.5)
WBC: 6.6 10*3/uL (ref 4.0–10.5)

## 2016-04-19 LAB — ETHANOL: Alcohol, Ethyl (B): <5 mg/dL (ref <5)

## 2016-04-19 MED ORDER — CLONIDINE HCL 0.1 MG PO TABS
0.1000 mg | ORAL_TABLET | Freq: Two times a day (BID) | ORAL | Status: DC
Start: 2016-04-19 — End: 2016-04-19
  Administered 2016-04-19: 0.1 mg via ORAL
  Filled 2016-04-19: qty 1

## 2016-04-19 MED ORDER — CLONIDINE HCL 0.2 MG PO TABS: 0.2000 mg | ORAL_TABLET | Freq: Two times a day (BID) | ORAL | 0 refills | Status: DC

## 2016-04-19 NOTE — ED Provider Notes (Signed)
[facility] Provider Note   CSN: [id number] Arrival date & time: 04/19/16  0926     History   Chief Complaint Chief Complaint  Patient presents with  . Addiction Problem    HPI Angel Lewis is a 65 y.o. male.  Patient referred in by [facility] for evaluation for substance abuse problems. Patient's been on prescribed narcotics long-standing due to significant arthritis and spondylosis. Patient's last dose of any narcotic pain medicine was Saturday at 12 noon. Patient does not want to continue taking these meds. He wants help getting off of them. Patient with a complaint of some diarrhea some tremors loss of appetite no nausea or vomiting. Patient denies any suicidal or homicidal ideation.      Past Medical History:  Diagnosis Date  . Arthritis   . Cancer (HCC)    prostate  . Hypertension   . Spondylolysis    all over     Patient Active Problem List   Diagnosis Date Noted  . History of colonic polyps   . Diverticulosis of colon without hemorrhage     Past Surgical History:  Procedure Laterality Date  . CATARACT EXTRACTION W/PHACO  07/26/2011   Procedure: CATARACT EXTRACTION PHACO AND INTRAOCULAR LENS PLACEMENT (IOC);  Surgeon: Kerry Hunt;  Location: [facility] ORS;  Service: Ophthalmology;  Laterality: Right;  CDE:10.88  . COLONOSCOPY    . COLONOSCOPY N/A 11/04/2014   Procedure: COLONOSCOPY;  Surgeon: Robert M Rourk, MD;  Location: [facility] ENDO SUITE;  Service: Endoscopy;  Laterality: N/A;  7:30 AM  . KNEE ARTHROSCOPY  1985   left  . PROSTATECTOMY  02/23/2005 [facility]   radical prostatectomy  . TONSILLECTOMY     as child       Home Medications    Prior to Admission medications   Medication Sig Start Date End Date Taking? Authorizing Provider  ALPRAZolam (XANAX) 0.5 MG tablet Take 0.5 mg by mouth at bedtime as needed for anxiety.    Historical Provider, MD  amLODipine-benazepril (LOTREL) 10-40 MG per capsule Take 1 capsule by mouth daily.     Historical Provider, MD  aspirin 81 MG tablet Take 162 mg by mouth at bedtime.     Historical Provider, MD  cholecalciferol (VITAMIN D) 1000 UNITS tablet Take 1,000 Units by mouth daily.      Historical Provider, MD  citalopram (CELEXA) 10 MG tablet Take 10 mg by mouth daily.    Historical Provider, MD  Coenzyme Q10 (CO Q-10) 100 MG CAPS Take 1 capsule by mouth daily.     Historical Provider, MD  Garlic 1000 MG CAPS Take 1 capsule by mouth daily.     Historical Provider, MD  HYDROcodone-acetaminophen (LORCET) 10-650 MG per tablet Take 1 tablet by mouth every 6 (six) hours as needed for pain. For pain     Historical Provider, MD  MAGNESIUM PO Take 1 tablet by mouth daily.    Historical Provider, MD  Milk Thistle 250 MG CAPS Take 2-3 capsules by mouth daily.     Historical Provider, MD  mometasone (NASONEX) 50 MCG/ACT nasal spray Place 2 sprays into the nose daily as needed. For congestion     Historical Provider, MD  peg 3350 powder (MOVIPREP) 100 G SOLR Take 1 kit (200 g total) by mouth as directed. 10/09/14   Robert M Rourk, MD    Family History Family History  Problem Relation Age of Onset  . Anesthesia problems Neg Hx   . Hypotension Neg Hx   .   Malignant hyperthermia Neg Hx   . Pseudochol deficiency Neg Hx     Social History Social History  Substance Use Topics  . Smoking status: Current Every Day Smoker    Packs/day: 0.25    Years: 30.00    Types: Cigarettes  . Smokeless tobacco: Not on file  . Alcohol use Yes     Comment: rarely     Allergies   Percodan [oxycodone-aspirin]   Review of Systems Review of Systems  Constitutional: Negative for fever.  HENT: Negative for congestion.   Eyes: Negative for visual disturbance.  Respiratory: Negative for shortness of breath.   Cardiovascular: Negative for chest pain.  Gastrointestinal: Positive for diarrhea. Negative for abdominal pain, nausea and vomiting.  Genitourinary: Negative for dysuria.  Musculoskeletal: Positive  for arthralgias. Negative for back pain.  Skin: Negative for rash.  Neurological: Positive for tremors. Negative for headaches.  Hematological: Does not bruise/bleed easily.  Psychiatric/Behavioral: Negative for confusion.     Physical Exam Updated Vital Signs BP 153/72   Pulse (!) 44   Temp 98 F (36.7 C) (Oral)   Resp 16   Ht 5' 7" (1.702 m)   Wt 81.6 kg   SpO2 100%   BMI 28.19 kg/m   Physical Exam  Constitutional: He is oriented to person, place, and time. He appears well-developed and well-nourished. No distress.  HENT:  Head: Normocephalic and atraumatic.  Mouth/Throat: Oropharynx is clear and moist.  Eyes: Conjunctivae and EOM are normal. Pupils are equal, round, and reactive to light.  Neck: Normal range of motion. Neck supple.  Cardiovascular: Normal rate, regular rhythm and normal heart sounds.   Pulmonary/Chest: Effort normal and breath sounds normal. No respiratory distress.  Abdominal: Soft. Bowel sounds are normal. There is no tenderness.  Musculoskeletal: Normal range of motion.  Neurological: He is alert and oriented to person, place, and time. No cranial nerve deficit. He exhibits normal muscle tone. Coordination normal.  Skin: Skin is warm.  Nursing note and vitals reviewed.    ED Treatments / Results  Labs (all labs ordered are listed, but only abnormal results are displayed) Labs Reviewed  COMPREHENSIVE METABOLIC PANEL - Abnormal; Notable for the following:       Result Value   Glucose, Bld 110 (*)    ALT 15 (*)    All other components within normal limits  ETHANOL  CBC  URINE RAPID DRUG SCREEN, HOSP PERFORMED    EKG  EKG Interpretation None       Radiology No results found.  Procedures Procedures (including critical care time)  Medications Ordered in ED Medications  cloNIDine (CATAPRES) tablet 0.1 mg (0.1 mg Oral Given 04/19/16 1620)     Initial Impression / Assessment and Plan / ED Course  I have reviewed the triage vital  signs and the nursing notes.  Pertinent labs & imaging results that were available during my care of the patient were reviewed by me and considered in my medical decision making (see chart for details).  Clinical Course    Patient referred in by [facility] for help with opioid prescription substance abuse problems. Patient last had pain medication on Saturday at 12 noon. Patient the is having some diarrhea mild tremors and loss of appetite. No nausea or vomiting. Patient contacted [facility] and they referred him here for evaluation. We'll have them evaluate him. They did evaluate him in recommending him further outpatient program. Patient provided information by them patient will be discharged. Patient treated here   with clonidine will be given some prescription for outpatient clonidine help and was treated with withdrawal symptoms. Patient nontoxic no acute distress.  Final Clinical Impressions(s) / ED Diagnoses   Final diagnoses:  Narcotic addiction [facility])    New Prescriptions New Prescriptions   No medications on file     Fredia Sorrow, MD 04/19/16 1902

## 2016-04-19 NOTE — BH Assessment (Addendum)
Tele Assessment Note   Angel Lewis is an 65 y.o. male  Who came to ED with his wife to get help for opioid dependence. Pt denies depression, SI, HI, AVH, or any previous mental health hx, but says he is taking 2x the number of Oxycodone he is supposed to for health issues and can't stop (taking 8-10 per day and is prescribed 4.  He last had some 3 days ago and states he is feeling OK. Pt was in treatment for this 2 years ago in [place] and did not like the program. He states his wife is very supportive, and he wants to do OP treatment.   Elmarie Shiley, NP recommends CD-IOP and Pt is agreeable.  TTS will send IOP information.  Diagnosis:Opioid Dependence  Past Medical History:  Past Medical History:  Diagnosis Date  . Arthritis   . Cancer [facility])    prostate  . Hypertension   . Spondylolysis    all over     Past Surgical History:  Procedure Laterality Date  . CATARACT EXTRACTION W/PHACO  07/26/2011   Procedure: CATARACT EXTRACTION PHACO AND INTRAOCULAR LENS PLACEMENT (IOC);  Surgeon: Tonny Branch;  Location: [facility] ORS;  Service: Ophthalmology;  Laterality: Right;  CDE:10.88  . COLONOSCOPY    . COLONOSCOPY N/A 11/04/2014   Procedure: COLONOSCOPY;  Surgeon: Daneil Dolin, MD;  Location: [facility] ENDO SU[facility];  Service: Endoscopy;  Laterality: N/A;  7:30 AM  . KNEE ARTHROSCOPY  1985   left  . PROSTATECTOMY  02/23/2005 [name]   radical prostatectomy  . TONSILLECTOMY     as child    Family History:  Family History  Problem Relation Age of Onset  . Anesthesia problems Neg Hx   . Hypotension Neg Hx   . Malignant hyperthermia Neg Hx   . Pseudochol deficiency Neg Hx     Social History:  reports that he has been smoking Cigarettes.  He has a 7.50 pack-year smoking history. He does not have any smokeless tobacco history on file. He reports that he drinks alcohol. He reports that he does not use drugs.  Additional Social History:  Alcohol / Drug Use Pain Medications:  Hydrocodone Prescriptions: Hydrocodone Over the Counter: none History of alcohol / drug use?: Yes Longest period of sobriety (when/how long): unk Substance #1 Name of Substance 1: oxycodone 1 - Age of First Use: 65 yo 1 - Amount (size/oz): 8-10 daily 1 - Frequency: daily 1 - Duration: 1 year 1 - Last Use / Amount: 3 days ago  CIWA: CIWA-Ar BP: 153/72 Pulse Rate: (!) 44 COWS: Clinical Opiate Withdrawal Scale (COWS) Resting Pulse Rate: Pulse Rate 80 or below Sweating: No report of chills or flushing Restlessness: Able to sit still Pupil Size: Pupils pinned or normal size for room light Bone or Joint Aches: Mild diffuse discomfort Runny Nose or Tearing: Not present GI Upset: Vomiting or diarrhea Tremor: No tremor Yawning: No yawning Anxiety or Irritability: None Gooseflesh Skin: Skin is smooth COWS Total Score: 4  PATIENT STRENGTHS: (choose at least two) Ability for insight Active sense of humor Average or above average intelligence Capable of independent living Communication skills Financial means General fund of knowledge Motivation for treatment/growth Physical Health Supportive family/friends  Allergies:  Allergies  Allergen Reactions  . Percodan [Oxycodone-Aspirin] Itching    Home Medications:  (Not in a hospital admission)  OB/GYN Status:  No LMP for male patient.  General Assessment Data Location of Assessment: [facility] ED TTS Assessment: In system Is this  a Tele or Face-to-Face Assessment?: Tele Assessment Is this an Initial Assessment or a Re-assessment for this encounter?: Initial Assessment Marital status: Married Living Arrangements: Spouse/significant other Can pt return to current living arrangement?: Yes Admission Status: Voluntary Is patient capable of signing voluntary admission?: Yes Referral Source: Self/Family/Friend Insurance type: [place] Living Arrangements: Spouse/significant other     Risk to self with the  past 6 months Suicidal Ideation: No Has patient been a risk to self within the past 6 months prior to admission? : No Suicidal Intent: No Has patient had any suicidal intent within the past 6 months prior to admission? : No Is patient at risk for suicide?: No Suicidal Plan?: No Has patient had any suicidal plan within the past 6 months prior to admission? : No Access to Means: No What has been your use of drugs/alcohol within the last 12 months?:  (see SA) Previous Attempts/Gestures: No Intentional Self Injurious Behavior: None Family Suicide History: Unknown Recent stressful life event(s):  (SA) Persecutory voices/beliefs?: No Depression: No Depression Symptoms:  (none) Substance abuse history and/or treatment for substance abuse?: Yes Suicide prevention information given to non-admitted patients: Not applicable  Risk to Others within the past 6 months Homicidal Ideation: No Does patient have any lifetime risk of violence toward others beyond the six months prior to admission? : No Thoughts of Harm to Others: No Current Homicidal Intent: No Current Homicidal Plan: No Access to Homicidal Means: No History of harm to others?: No Assessment of Violence: None Noted Does patient have access to weapons?: No Criminal Charges Pending?: No Does patient have a court date: No Is patient on probation?: No  Psychosis Hallucinations: None noted Delusions: None noted  Mental Status Report Appearance/Hygiene: Unremarkable Eye Contact: Good Motor Activity: Unremarkable Speech: Logical/coherent Level of Consciousness: Alert Mood: Pleasant Affect: Appropriate to circumstance Anxiety Level: None Thought Processes: Coherent, Relevant Judgement: Partial Orientation: Person, Place, Time, Situation, Appropriate for developmental age Obsessive Compulsive Thoughts/Behaviors: None  Cognitive Functioning Concentration: Normal Memory: Recent Intact, Remote Intact IQ: Average Insight:  Good Impulse Control: Fair Appetite: Good Weight Loss: 0 Weight Gain: 0 Sleep: No Change Total Hours of Sleep: 0 Vegetative Symptoms: None  ADLScreening [facility] Assessment Services) Patient's cognitive ability adequate to safely complete daily activities?: Yes Patient able to express need for assistance with ADLs?: Yes Independently performs ADLs?: No  Prior Inpatient Therapy Prior Inpatient Therapy: Yes Prior Therapy Dates: 2 yrs ago Prior Therapy Facilty/Provider(s): [place] Reason for Treatment: opiates  Prior Outpatient Therapy Prior Outpatient Therapy: No Does patient have an ACCT team?: No Does patient have Intensive In-House Services?  : No Does patient have [facility] services? : No Does patient have P4CC services?: No  ADL Screening (condition at time of admission) Patient's cognitive ability adequate to safely complete daily activities?: Yes Is the patient deaf or have difficulty hearing?: No Does the patient have difficulty seeing, even when wearing glasses/contacts?: No Does the patient have difficulty concentrating, remembering, or making decisions?: No Patient able to express need for assistance with ADLs?: Yes Does the patient have difficulty dressing or bathing?: No Independently performs ADLs?: No Communication: Independent Does the patient have difficulty walking or climbing stairs?: No Weakness of Legs: None Weakness of Arms/Hands: None  Home Assistive Devices/Equipment Home Assistive Devices/Equipment: None    Abuse/Neglect Assessment (Assessment to be complete while patient is alone) Physical Abuse: Denies Verbal Abuse: Denies Sexual Abuse: Denies Exploitation of patient/patient's resources: Denies Self-Neglect: Denies Values / Beliefs  Cultural Requests During Hospitalization: None Spiritual Requests During Hospitalization: None Consults Spiritual Care Consult Needed: No Social Work Consult Needed: No Regulatory affairs officer (For Healthcare) Does  patient have an advance directive?: Yes Type of Advance Directive: Living will    Additional Information 1:1 In Past 12 Months?: No CIRT Risk: No Elopement Risk: No Does patient have medical clearance?: Yes     Disposition:  Disposition Initial Assessment Completed for this Encounter: Yes Disposition of Patient: Outpatient treatment, Referred to (IOP) Type of outpatient treatment: Chemical Dependence - Intensive Outpatient Patient referred to:  [facility] IOP)  Herberta Pickron Hines 04/19/2016 4:56 PM

## 2016-04-19 NOTE — ED Triage Notes (Signed)
Pt reports being a "hydrocodone addict", pt has been prescribed this medication x12 years for arthritis.  Pt was prescribed last bottle of medicine on 03/31/16 (120tablets) and all were gone on 04/10/16.  Pt last use was Saturday at 12pm.  Pt having diarrhea, mild tremors, loss of appetite.  Pt alert and oriented speaking in full sentences.  Pt denies si/hi.

## 2016-04-19 NOTE — Discharge Instructions (Signed)
Follow-up as per home behavioral health. Follow the instructions provided to you by behavioral health further outpatient substance abuse program.  Take the clonidine as directed to help you with the withdrawal symptoms. It could lower your blood pressure and make you lightheaded if that's the case then stop it.

## 2016-04-22 ENCOUNTER — Ambulatory Visit (INDEPENDENT_AMBULATORY_CARE_PROVIDER_SITE_OTHER): Payer: PPO | Admitting: Psychology

## 2016-04-22 ENCOUNTER — Encounter (HOSPITAL_COMMUNITY): Payer: Self-pay | Admitting: Psychology

## 2016-04-22 DIAGNOSIS — F1994 Other psychoactive substance use, unspecified with psychoactive substance-induced mood disorder: Secondary | ICD-10-CM | POA: Diagnosis not present

## 2016-04-22 DIAGNOSIS — F1120 Opioid dependence, uncomplicated: Secondary | ICD-10-CM | POA: Diagnosis not present

## 2016-04-22 NOTE — Progress Notes (Signed)
Comprehensive Clinical Assessment (CCA) Note  04/22/2016 NORWOOD DENHART [id number]  Visit Diagnosis:      ICD-9-CM ICD-10-CM   1. Opioid use disorder, severe, dependence ([place]) 304.00 F11.20   2. Substance induced mood disorder ([facility]) 292.84 F19.94       CCA Part One  Part One has been completed on paper by the patient.  (See scanned document in Chart Review)  CCA Part Two A  Intake/Chief Complaint:  CCA Intake With Chief Complaint CCA Part Two Date: 04/22/16 CCA Part Two Time: 27 Chief Complaint/Presenting Problem: Dependency on Hydrocodone, chronic pain from ankleosis Patients Currently Reported Symptoms/Problems: pain, loss of energy, loss of interest, worry about needing medication Collateral Involvement: wife tried to help manage hydrocodone, wants to help him get off Individual's Strengths: hard worker, faith in [place], married 22 years, retired Therapist, art Abilities: able bodied, pain in ankle,  Type of Services Patient Feels Are Needed: long term pain medication, suboxone, help with addiction to hydrocodone  Mental Health Symptoms Depression:  Depression: Change in energy/activity, Fatigue, Hopelessness, Irritability, Sleep (too much or little), Worthlessness, Increase/decrease in appetite, Difficulty Concentrating  Mania:     Anxiety:   Anxiety: Difficulty concentrating, Fatigue, Irritability, Restlessness  Psychosis:     Trauma:  Trauma: Avoids reminders of event  Obsessions:  Obsessions: Cause anxiety  Compulsions:     Inattention:     Hyperactivity/Impulsivity:  Hyperactivity/Impulsivity: Always on the go (high energy on hydrocodone)  Oppositional/Defiant Behaviors:     Borderline Personality:  Emotional Irregularity: Mood lability  Other Mood/Personality Symptoms:  Other Mood/Personality Symtpoms: most likely substance-induced mood disorder, reports symptoms of mania in father (excessive spending, "went beserk"), wife reports pt has symptoms of depression followed by  mania   Mental Status Exam Appearance and self-care  Stature:  Stature: Average  Weight:  Weight: Average weight  Clothing:  Clothing: Casual  Grooming:  Grooming: Normal  Cosmetic use:  Cosmetic Use: None  Posture/gait:  Posture/Gait: Normal  Motor activity:  Motor Activity: Not Remarkable  Sensorium  Attention:  Attention: Normal  Concentration:  Concentration: Normal  Orientation:  Orientation: X5  Recall/memory:  Recall/Memory: Normal  Affect and Mood  Affect:  Affect: Appropriate  Mood:     Relating  Eye contact:  Eye Contact: Normal  Facial expression:  Facial Expression: Responsive  Attitude toward examiner:  Attitude Toward Examiner: Cooperative  Thought and Language  Speech flow: Speech Flow: Normal  Thought content:  Thought Content: Appropriate to mood and circumstances  Preoccupation:  Preoccupations: Guilt, Obsessions, Other (Comment)  Hallucinations:     Organization:     Transport planner of Knowledge:  Fund of Knowledge: Average  Intelligence:  Intelligence: Average  Abstraction:  Abstraction: Psychologist, sport and exercise:  Judgement: Common-sensical  Reality Testing:     Insight:  Insight: Fair, Flashes of insight, Gaps  Decision Making:  Decision Making: Normal, Confused  Social Functioning  Social Maturity:  Social Maturity: Responsible  Social Judgement:  Social Judgement: "Games developer"  Stress  Stressors:  Stressors: Family conflict, Grief/losses (mother passed away 2013/10/31)  Coping Ability:  Coping Ability: Normal, Resilient  Skill Deficits:     Supports:      Family and Psychosocial History: Family history Marital status: Married Number of Years Married: 75 What types of issues is patient dealing with in the relationship?: conflict with wife over pt substance dependence What is your sexual orientation?: heterosexual Does patient have children?: Yes  Childhood History:  Childhood History By whom was/is the  patient raised?: Both  parents Additional childhood history information: father war veteran- mental health issues Patient's description of current relationship with people who raised him/her: deceased Does patient have siblings?: Yes Number of Siblings: 1 Description of patient's current relationship with siblings: very good Did patient suffer from severe childhood neglect?: Yes (father- veteran, had mental health "breakdown" when client was 65 yo) Has patient ever been sexually abused/assaulted/raped as an adolescent or adult?: No Was the patient ever a victim of a crime or a disaster?: No Witnessed domestic violence?: No Has patient been effected by domestic violence as an adult?: No  CCA Part Two B  Employment/Work Situation: Employment / Work Copywriter, advertising Employment situation: Retired Archivist job has been impacted by current illness: No What is the longest time patient has a held a job?: 34 years Has patient ever been in the [employer]?: No Has patient ever served in combat?: No Are There Guns or Other Weapons in [place]?: Yes Are These Psychologist, educational?: Yes  Education: Education Last Grade Completed: 12 Name of [place]: [place], [place] Did Teacher, adult education From [employer]?: Yes Did You Have Any Chief Technology Officer In School?: cars, music, Neurosurgeon  Religion: Religion/Spirituality Are You A Religious Person?: Yes What is Your Religious Affiliation?: Baptist How Might This Affect Treatment?: be open to faith  Leisure/Recreation: Leisure / Recreation Leisure and Hobbies: read, farming, gardening  Exercise/Diet: Exercise/Diet Do You Exercise?: Yes How Many Times a Week Do You Exercise?: 1-3 times a week Have You Gained or Lost A Significant Amount of Weight in the Past Six Months?: Yes-Gained Number of Pounds Gained: 8 Do You Follow a Special Diet?: No Do You Have Any Trouble Sleeping?: No  CCA Part Two C  Alcohol/Drug Use: Alcohol / Drug Use Pain Medications:  Hydrocodone Prescriptions: Hydrocodone Over the Counter: none History of alcohol / drug use?: Yes Longest period of sobriety (when/how long): 2 weeks Negative Consequences of Use: Personal relationships Withdrawal Symptoms: Agitation, Fever / Chills, Irritability, Diarrhea, Tremors, Nausea / Vomiting, Sweats                      CCA Part Three  ASAM's:  Six Dimensions of Multidimensional Assessment  Dimension 1:  Acute Intoxication and/or Withdrawal Potential:     Dimension 2:  Biomedical Conditions and Complications:     Dimension 3:  Emotional, Behavioral, or Cognitive Conditions and Complications:     Dimension 4:  Readiness to Change:     Dimension 5:  Relapse, Continued use, or Continued Problem Potential:     Dimension 6:  Recovery/Living Environment:      Substance use Disorder (SUD) Substance Use Disorder (SUD)  Checklist Symptoms of Substance Use: Continued use despite having a persistent/recurrent physical/psychological problem caused/exacerbated by use, Continued use despite persistent or recurrent social, interpersonal problems, caused or exacerbated by use, Evidence of tolerance, Presence of craving or strong urge to use, Persistent desire or unsuccessful efforts to cut down or control use, Large amounts of time spent to obtain, use or recover from the substance(s), Evidence of withdrawal (Comment), Recurrent use that results in a fialure to fulfill major rule obligatinos (work, school, home), Repeated use in physically hazardous situations, Social, occupational, recreational activities given up or reduced due to use, Substance(s) often taken in large amounts or over longer times than was intended  Social Function:  Social Functioning Social Maturity: Responsible Social Judgement: "Games developer"  Stress:  Stress Stressors: Family conflict, Grief/losses (mother passed away  2015) Coping Ability: Normal, Resilient Patient Takes Medications The Way The Doctor  Instructed?: No Priority Risk: High Risk  Risk Assessment- Self-Harm Potential: Risk Assessment For Self-Harm Potential Thoughts of Self-Harm: No current thoughts Method: No plan Availability of Means: No access/NA  Risk Assessment -Dangerous to Others Potential: Risk Assessment For Dangerous to Others Potential Method: No Plan Availability of Means: No access or NA  DSM5 Diagnoses: Patient Active Problem List   Diagnosis Date Noted  . Opioid use disorder, severe, dependence ([place]) 04/22/2016  . History of colonic polyps   . Diverticulosis of colon without hemorrhage     Patient Centered Plan: Patient is on the following Treatment Plan(s): pending  Recommendations for Services/Supports/Treatments: Recommendations for Services/Supports/Treatments Recommendations For Services/Supports/Treatments: Detox  Treatment Plan Summary:    Referrals to Alternative Service(s): Referred to Alternative Service(s):   Place:   Date:   Time:    Referred to Alternative Service(s):   Place:   Date:   Time:    Referred to Alternative Service(s):   Place:   Date:   Time:    Referred to Alternative Service(s):   Place:   Date:   Time:     Brandon Melnick

## 2016-10-07 DIAGNOSIS — Z719 Counseling, unspecified: Secondary | ICD-10-CM | POA: Diagnosis not present

## 2016-10-07 DIAGNOSIS — Z1389 Encounter for screening for other disorder: Secondary | ICD-10-CM | POA: Diagnosis not present

## 2016-10-07 DIAGNOSIS — E782 Mixed hyperlipidemia: Secondary | ICD-10-CM | POA: Diagnosis not present

## 2016-10-07 DIAGNOSIS — I10 Essential (primary) hypertension: Secondary | ICD-10-CM | POA: Diagnosis not present

## 2016-10-07 DIAGNOSIS — E538 Deficiency of other specified B group vitamins: Secondary | ICD-10-CM | POA: Diagnosis not present

## 2016-10-07 DIAGNOSIS — Z6830 Body mass index (BMI) 30.0-30.9, adult: Secondary | ICD-10-CM | POA: Diagnosis not present

## 2016-10-07 DIAGNOSIS — F321 Major depressive disorder, single episode, moderate: Secondary | ICD-10-CM | POA: Diagnosis not present

## 2016-10-28 DIAGNOSIS — Z6829 Body mass index (BMI) 29.0-29.9, adult: Secondary | ICD-10-CM | POA: Diagnosis not present

## 2016-10-28 DIAGNOSIS — M459 Ankylosing spondylitis of unspecified sites in spine: Secondary | ICD-10-CM | POA: Diagnosis not present

## 2016-10-28 DIAGNOSIS — E663 Overweight: Secondary | ICD-10-CM | POA: Diagnosis not present

## 2016-10-28 DIAGNOSIS — R7309 Other abnormal glucose: Secondary | ICD-10-CM | POA: Diagnosis not present

## 2016-12-20 NOTE — Progress Notes (Signed)
Office Visit Note  Patient: Angel Lewis             Date of Birth: 06-Feb-1951           MRN: [id number]             PCP: Sharilyn Sites, MD Referring: Sharilyn Sites, MD Visit Date: 12/23/2016 Occupation: @GUAROCC @    Subjective:  Pain in multiple joints.   History of Present Illness: Angel Lewis is a 66 y.o. male seen in consultation per request of his PCP. According to patient his symptoms started at age 45 with left knee joint injury. He states by age 14 he started having pain in his bilateral knee joints and bilateral hips. The symptoms gradually got worse over time. He states he left with pain all his life. In his 27s he started seeing an orthopedic surgeon and done well who diagnosed with ankylosing spondylitis and treated him with Bufferin. He also advised him to stay active and keep his weight low he states after 5 years orthopedic surgeon moved from there and he is been under care of his PCP. He was treated with hydrocodone which he took for several years. He states he had a very physical job and he could not function without taking hydrocodone. He retired about 1-1/2 years ago and has been trying to get off the narcotics. He is going to a drug rehabilitation program currently. He recalls in 1980s he had left knee joint meniscal tear repair and was told that he had osteoarthritis in his left knee. He complains of pain in his neck, lower back, shoulders, left hand, hip joints, knee joints. He has had history of some plantar fasciitis. He denies any joint swelling. He takes Aleve on when necessary basis. He denies any history of shortness of breath. He has had history of recurrent iritis which was treated with eyedrops. He has developed cataract in his right eye. There is family history of arthritis in his mother and his brother which is been diagnosed as ankylosing spondylitis as well.  Activities of Daily Living:  Patient reports morning stiffness for 45 minutes.   Patient Denies  nocturnal pain.  Difficulty dressing/grooming: Denies Difficulty climbing stairs: Reports Difficulty getting out of chair: Denies Difficulty using hands for taps, buttons, cutlery, and/or writing: Denies   Review of Systems  Constitutional: Positive for fatigue. Negative for night sweats and weakness ( ).  HENT: Positive for mouth dryness. Negative for mouth sores and nose dryness.   Eyes: Negative for redness and dryness.  Respiratory: Negative for shortness of breath and difficulty breathing.   Cardiovascular: Positive for hypertension. Negative for chest pain, palpitations, irregular heartbeat and swelling in legs/feet.  Gastrointestinal: Negative for constipation and diarrhea.  Endocrine: Negative for increased urination.  Musculoskeletal: Positive for arthralgias, joint pain and morning stiffness. Negative for joint swelling, myalgias, muscle weakness, muscle tenderness and myalgias.  Skin: Negative for color change, rash, hair loss, nodules/bumps, skin tightness, ulcers and sensitivity to sunlight.  Allergic/Immunologic: Negative for susceptible to infections.  Neurological: Negative for dizziness, fainting, memory loss and night sweats.  Hematological: Negative for swollen glands.  Psychiatric/Behavioral: Positive for depressed mood. Negative for sleep disturbance. The patient is not nervous/anxious.     PMFS History:  Patient Active Problem List   Diagnosis Date Noted  . Opioid use disorder, severe, dependence ([place]) 04/22/2016  . History of colonic polyps   . Diverticulosis of colon without hemorrhage     Past Medical History:  Diagnosis  Date  . Arthritis   . Cancer [facility])    prostate  . Hypertension   . Spondylolysis    all over     Family History  Problem Relation Age of Onset  . Anesthesia problems Neg Hx   . Hypotension Neg Hx   . Malignant hyperthermia Neg Hx   . Pseudochol deficiency Neg Hx    Past Surgical History:  Procedure Laterality Date  . CATARACT  EXTRACTION W/PHACO  07/26/2011   Procedure: CATARACT EXTRACTION PHACO AND INTRAOCULAR LENS PLACEMENT (IOC);  Surgeon: Tonny Branch;  Location: [facility] ORS;  Service: Ophthalmology;  Laterality: Right;  CDE:10.88  . COLONOSCOPY    . COLONOSCOPY N/A 11/04/2014   Procedure: COLONOSCOPY;  Surgeon: Daneil Dolin, MD;  Location: [facility] END[facility] SUITE;  Service: Endoscopy;  Laterality: N/A;  7:30 AM  . KNEE ARTHROSCOPY  1985   left  . PROSTATECTOMY  02/23/2005 [name]   radical prostatectomy  . TONSILLECTOMY     as child   Social History   Social History Narrative  . No narrative on file     Objective: Vital Signs: BP 136/80 (BP Location: Right Arm)   Pulse 64   Resp 14   Ht 5' 7"  (1.702 m)   Wt 190 lb (86.2 kg)   BMI 29.76 kg/m    Physical Exam  Constitutional: He is oriented to person, place, and time. He appears well-developed and well-nourished.  HENT:  Head: Normocephalic and atraumatic.  Eyes: Conjunctivae and EOM are normal. Pupils are equal, round, and reactive to light.  Neck: Normal range of motion. Neck supple.  Cardiovascular: Normal rate, regular rhythm and normal heart sounds.   Pulmonary/Chest: Effort normal and breath sounds normal.  Abdominal: Soft. Bowel sounds are normal.  Neurological: He is alert and oriented to person, place, and time.  Skin: Skin is warm and dry. Capillary refill takes less than 2 seconds.  Psychiatric: He has a normal mood and affect. His behavior is normal.  Nursing note and vitals reviewed.    Musculoskeletal Exam: C-spine limited range of motion. He has some thoracic kyphosis. Lumbar spine Schober's limited to 4 cm. Finger to toe distance was 8 cm. Shoulder joints elbow joints wrist joints are good range of motion. He has to buttress contracture in his left third and fourth digit. He tenderness and some of his PIP joints but no synovitis was noted. Painful range of motion of bilateral hip joints some tenderness over SI joints. Knee joints ankles MTPs  PIPs with good range of motion. There was no evidence of Achillis tendinitis of plantar fasciitis.  CDAI Exam: CDAI Homunculus Exam:   Tenderness:  RUE: glenohumeral LUE: glenohumeral Right hand: 2nd PIP Left hand: 2nd PIP RLE: acetabulofemoral and tibiofemoral LLE: acetabulofemoral and tibiofemoral  Joint Counts:  CDAI Tender Joint count: 6 CDAI Swollen Joint count: 0  Global Assessments:  Patient Global Assessment: 6 Provider Global Assessment: 6  CDAI Calculated Score: 18    Investigation: Findings:  10/07/2016 CBC normal, CMP elevated Glucose 109, BUN elevated 26, otherwise normal  LDL 108, TSH normal 1.560, and Vitamin B 12 normal     Imaging: Xr Hip Unilat W Or W/o Pelvis 2-3 Views Left  Result Date: 12/23/2016 Left hip joint space appears normal, left SI joint is fused Normal left hip joint. Fused left SI joint  Xr Hip Unilat W Or W/o Pelvis 2-3 Views Right  Result Date: 12/23/2016 Right superior lateral narrowing was noted right SI joint appears fused  Moderate arthritis of the right hip joint.  Xr Thoracic Spine 2 View  Result Date: 12/23/2016 Few syndesmophytes were noted in the thoracic region. Mild multilevel spondylosis was noted.  Xr Hand 2 View Left  Result Date: 12/23/2016 PIP/DIP narrowing no MCP joint narrowing or erosive changes were noted no intercarpal joint space narrowing or erosive changes were noted. These findings were consistent with mild osteoarthritis  Xr Hand 2 View Right  Result Date: 12/23/2016 PIP/DIP narrowing no MCP joint narrowing or erosive changes were noted no intercarpal joint space narrowing or erosive changes were noted. These findings were consistent with mild osteoarthritis  Xr Knee 3 View Left  Result Date: 12/23/2016 Severe medial compartment narrowing was noted. Mild patellofemoral narrowing was noted. No chondrocalcinosis was noted. Findings are consistent with severe osteoarthritis and mild chondromalacia  patella  Xr Knee 3 View Right  Result Date: 12/23/2016 Moderate medial compartment narrowing was noted. Intercondylar osteophytes were noted. No chondrocalcinosis was noted. Mild patellofemoral narrowing was noted. Impression moderate osteoarthritis and mild chondromalacia patella  Xr Lumbar Spine 2-3 Views  Result Date: 12/23/2016 Anterior osteophytes were noted. Facet joint arthropathy was noted. Some squaring of vertebrae were noted. Mild is spondylosis and facet joint arthropathy was noted   Speciality Comments: No specialty comments available.    Procedures:  No procedures performed Allergies: Percodan [oxycodone-aspirin]   Assessment / Plan:     Visit Diagnoses: Ankylosing spondylitis of lumbosacral region [facility]): Patient gives a long-standing history of ankylosing spondylitis diagnosed by his orthopedic surgeon. He also gives history of ankylosing spondylitis in his mother and brother. He gives history of multiple arthralgias as described above. History of plantar fasciitis and iritis. He has somewhat limited mobility is lumbar and C-spine. I'll obtain following x-rays and labs today. He's been taking Aleve for pain relief and has taken Bufferin for multiple years. He does have left SI joint fusion and had narrowing of the right SI joint. I'll schedule MRI of the SI joints to rule out sacroiliitis.  Pain in thoracic spine - Plan: XR Thoracic Spine 2 View. Few syndesmophytes were noted  Chronic bilateral low back pain without sciatica - Plan: XR Lumbar Spine 2-3 Views, mild is spondylosis and facet joint arthropathy was noted HLA-B27 antigen  Pain in both hands - Plan: XR Hand 2 View Right, XR Hand 2 View Left, mild osteoarthritis was noted Rheumatoid factor, Cyclic citrul peptide antibody, IgG, Sedimentation rate  Hip pain, bilateral - Plan: XR HIP UNILAT W OR W/O PELVIS 2-3 VIEWS RIGHT, XR HIP UNILAT W OR W/O PELVIS 2-3 VIEWS LEFT moderate osteoarthritis of the right hip joint was  noted left hip joint was within normal limits.   Chronic pain of both knees - Plan: XR KNEE 3 VIEW RIGHT, XR KNEE 3 VIEW LEFT revealed right moderate osteoarthritis and left knee joints severe osteoarthritis with bilateral mild chondromalacia patella  History of Bilateral plantar fasciitis: No recent flares  History of iritis: No recent flare  History of prostate cancer  History of diverticulosis  Opioid use disorder, severe, dependence ([place])  / on SUBOXONE   Family history of ankylosing spondylitis - Mother and brother  Other fatigue - Plan: CK, ANA, IgG, IgA, IgM, Serum protein electrophoresis with reflex, Hepatitis C antibody, Hepatitis B surface antigen, Hepatitis B core antibody, IgM, Quantiferon tb gold assay (blood), HIV antibody    Orders: Orders Placed This Encounter  Procedures  . XR Hand 2 View Right  . XR Hand 2 View Left  .  XR HIP UNILAT W OR W/O PELVIS 2-3 VIEWS RIGHT  . XR HIP UNILAT W OR W/O PELVIS 2-3 VIEWS LEFT  . XR KNEE 3 VIEW RIGHT  . XR KNEE 3 VIEW LEFT  . XR Thoracic Spine 2 View  . XR Lumbar Spine 2-3 Views  . CK  . ANA  . Rheumatoid factor  . Cyclic citrul peptide antibody, IgG  . HLA-B27 antigen  . IgG, IgA, IgM  . Serum protein electrophoresis with reflex  . Hepatitis C antibody  . Hepatitis B surface antigen  . Hepatitis B core antibody, IgM  . Quantiferon tb gold assay (blood)  . HIV antibody  . Sedimentation rate   No orders of the defined types were placed in this encounter.   Face-to-face time spent with patient was 50 minutes. 50% of time was spent in counseling and coordination of care.  Follow-Up Instructions: Return for Ankylosing spondylitis.   Bo Merino, MD  Note - This record has been created using Editor, commissioning.  Chart creation errors have been sought, but may not always  have been located. Such creation errors do not reflect on  the standard of medical care.

## 2016-12-23 ENCOUNTER — Ambulatory Visit (INDEPENDENT_AMBULATORY_CARE_PROVIDER_SITE_OTHER): Payer: Medicare Other

## 2016-12-23 ENCOUNTER — Ambulatory Visit (INDEPENDENT_AMBULATORY_CARE_PROVIDER_SITE_OTHER): Payer: Medicare Other | Admitting: Rheumatology

## 2016-12-23 ENCOUNTER — Encounter: Payer: Self-pay | Admitting: Rheumatology

## 2016-12-23 ENCOUNTER — Other Ambulatory Visit: Payer: Self-pay | Admitting: *Deleted

## 2016-12-23 VITALS — BP 136/80 | HR 64 | Resp 14 | Ht 67.0 in | Wt 190.0 lb

## 2016-12-23 DIAGNOSIS — M25551 Pain in right hip: Secondary | ICD-10-CM

## 2016-12-23 DIAGNOSIS — M79641 Pain in right hand: Secondary | ICD-10-CM | POA: Diagnosis not present

## 2016-12-23 DIAGNOSIS — M545 Low back pain: Secondary | ICD-10-CM

## 2016-12-23 DIAGNOSIS — M25561 Pain in right knee: Secondary | ICD-10-CM

## 2016-12-23 DIAGNOSIS — M25562 Pain in left knee: Secondary | ICD-10-CM

## 2016-12-23 DIAGNOSIS — M79642 Pain in left hand: Secondary | ICD-10-CM

## 2016-12-23 DIAGNOSIS — Z8669 Personal history of other diseases of the nervous system and sense organs: Secondary | ICD-10-CM | POA: Diagnosis not present

## 2016-12-23 DIAGNOSIS — Z8546 Personal history of malignant neoplasm of prostate: Secondary | ICD-10-CM

## 2016-12-23 DIAGNOSIS — R5383 Other fatigue: Secondary | ICD-10-CM | POA: Diagnosis not present

## 2016-12-23 DIAGNOSIS — M722 Plantar fascial fibromatosis: Secondary | ICD-10-CM

## 2016-12-23 DIAGNOSIS — Z8269 Family history of other diseases of the musculoskeletal system and connective tissue: Secondary | ICD-10-CM | POA: Diagnosis not present

## 2016-12-23 DIAGNOSIS — M25552 Pain in left hip: Secondary | ICD-10-CM | POA: Diagnosis not present

## 2016-12-23 DIAGNOSIS — M457 Ankylosing spondylitis of lumbosacral region: Secondary | ICD-10-CM

## 2016-12-23 DIAGNOSIS — G8929 Other chronic pain: Secondary | ICD-10-CM

## 2016-12-23 DIAGNOSIS — M546 Pain in thoracic spine: Secondary | ICD-10-CM

## 2016-12-23 DIAGNOSIS — F1120 Opioid dependence, uncomplicated: Secondary | ICD-10-CM

## 2016-12-23 DIAGNOSIS — R102 Pelvic and perineal pain: Secondary | ICD-10-CM

## 2016-12-23 DIAGNOSIS — Z8719 Personal history of other diseases of the digestive system: Secondary | ICD-10-CM

## 2016-12-24 LAB — IGG, IGA, IGM
IgA: 225 mg/dL (ref 81–463)
IgG (Immunoglobin G), Serum: 772 mg/dL (ref 694–1618)
IgM, Serum: 60 mg/dL (ref 48–271)

## 2016-12-24 LAB — RHEUMATOID FACTOR: Rhuematoid fact SerPl-aCnc: <14 IU/mL (ref <14)

## 2016-12-24 LAB — HEPATITIS B SURFACE ANTIGEN: Hepatitis B Surface Ag: NEGATIVE

## 2016-12-24 LAB — CYCLIC CITRUL PEPTIDE ANTIBODY, IGG

## 2016-12-24 LAB — CK: Total CK: 55 U/L (ref 44–196)

## 2016-12-24 LAB — SEDIMENTATION RATE: Sed Rate: 1 mm/hr (ref 0–20)

## 2016-12-24 LAB — HEPATITIS B CORE ANTIBODY, IGM: HEP B C IGM: NONREACTIVE

## 2016-12-24 LAB — HEPATITIS C ANTIBODY: HCV Ab: NEGATIVE

## 2016-12-24 LAB — HIV ANTIBODY (ROUTINE TESTING W REFLEX): HIV: NONREACTIVE

## 2016-12-24 LAB — ANA: Anti Nuclear Antibody(ANA): NEGATIVE

## 2016-12-26 LAB — QUANTIFERON TB GOLD ASSAY (BLOOD)
INTERFERON GAMMA RELEASE ASSAY: NEGATIVE
Mitogen-Nil: 8.97 IU/mL
QUANTIFERON NIL VALUE: 0.04 [IU]/mL

## 2016-12-27 LAB — PROTEIN ELECTROPHORESIS, SERUM, WITH REFLEX
ALBUMIN ELP: 4 g/dL (ref 3.8–4.8)
ALPHA-1-GLOBULIN: 0.5 g/dL — AB (ref 0.2–0.3)
Alpha-2-Globulin: 0.9 g/dL (ref 0.5–0.9)
BETA 2: 0.4 g/dL (ref 0.2–0.5)
BETA GLOBULIN: 0.5 g/dL (ref 0.4–0.6)
Gamma Globulin: 0.8 g/dL (ref 0.8–1.7)
Total Protein, Serum Electrophoresis: 7 g/dL (ref 6.1–8.1)

## 2016-12-27 LAB — IFE INTERPRETATION

## 2016-12-28 LAB — HLA-B27 ANTIGEN: DNA RESULT: POSITIVE — AB

## 2016-12-29 NOTE — Progress Notes (Signed)
Will discuss at follow up visit

## 2017-01-03 ENCOUNTER — Ambulatory Visit (HOSPITAL_COMMUNITY): Payer: Medicare Other

## 2017-01-13 DIAGNOSIS — M79641 Pain in right hand: Secondary | ICD-10-CM | POA: Insufficient documentation

## 2017-01-13 DIAGNOSIS — Z8719 Personal history of other diseases of the digestive system: Secondary | ICD-10-CM | POA: Insufficient documentation

## 2017-01-13 DIAGNOSIS — M25552 Pain in left hip: Secondary | ICD-10-CM

## 2017-01-13 DIAGNOSIS — R5383 Other fatigue: Secondary | ICD-10-CM | POA: Insufficient documentation

## 2017-01-13 DIAGNOSIS — M546 Pain in thoracic spine: Secondary | ICD-10-CM | POA: Insufficient documentation

## 2017-01-13 DIAGNOSIS — Z8546 Personal history of malignant neoplasm of prostate: Secondary | ICD-10-CM | POA: Insufficient documentation

## 2017-01-13 DIAGNOSIS — M25551 Pain in right hip: Secondary | ICD-10-CM | POA: Insufficient documentation

## 2017-01-13 DIAGNOSIS — M545 Low back pain, unspecified: Secondary | ICD-10-CM | POA: Insufficient documentation

## 2017-01-13 DIAGNOSIS — M457 Ankylosing spondylitis of lumbosacral region: Secondary | ICD-10-CM | POA: Insufficient documentation

## 2017-01-13 DIAGNOSIS — M25562 Pain in left knee: Secondary | ICD-10-CM

## 2017-01-13 DIAGNOSIS — M25561 Pain in right knee: Secondary | ICD-10-CM

## 2017-01-13 DIAGNOSIS — M722 Plantar fascial fibromatosis: Secondary | ICD-10-CM | POA: Insufficient documentation

## 2017-01-13 DIAGNOSIS — M79642 Pain in left hand: Secondary | ICD-10-CM

## 2017-01-13 DIAGNOSIS — Z1589 Genetic susceptibility to other disease: Secondary | ICD-10-CM | POA: Insufficient documentation

## 2017-01-13 DIAGNOSIS — G8929 Other chronic pain: Secondary | ICD-10-CM | POA: Insufficient documentation

## 2017-01-13 DIAGNOSIS — Z8669 Personal history of other diseases of the nervous system and sense organs: Secondary | ICD-10-CM | POA: Insufficient documentation

## 2017-01-13 DIAGNOSIS — Z8269 Family history of other diseases of the musculoskeletal system and connective tissue: Secondary | ICD-10-CM | POA: Insufficient documentation

## 2017-01-13 NOTE — Progress Notes (Deleted)
Office Visit Note  Patient: Angel Lewis             Date of Birth: Dec 07, 1950           MRN: [id number]             PCP: Angel Sites, MD Referring: Angel Lincoln, MD Visit Date: 01/18/2017 Occupation: @GUAROCC @    Subjective:  No chief complaint on file.   History of Present Illness: Angel Lewis is a 66 y.o. male ***   Activities of Daily Living:  Patient reports morning stiffness for *** {minute/hour:19697}.   Patient {ACTIONS;DENIES/REPORTS:21021675::"Denies"} nocturnal pain.  Difficulty dressing/grooming: {ACTIONS;DENIES/REPORTS:21021675::"Denies"} Difficulty climbing stairs: {ACTIONS;DENIES/REPORTS:21021675::"Denies"} Difficulty getting out of chair: {ACTIONS;DENIES/REPORTS:21021675::"Denies"} Difficulty using hands for taps, buttons, cutlery, and/or writing: {ACTIONS;DENIES/REPORTS:21021675::"Denies"}   No Rheumatology ROS completed.   PMFS History:  Patient Active Problem List   Diagnosis Date Noted  . Ankylosing spondylitis of lumbosacral region ([place]) 01/13/2017  . Pain in thoracic spine 01/13/2017  . Chronic bilateral low back pain without sciatica 01/13/2017  . Pain in both hands 01/13/2017  . Hip pain, bilateral 01/13/2017  . Chronic pain of both knees 01/13/2017  . Bilateral plantar fasciitis 01/13/2017  . History of iritis 01/13/2017  . History of prostate cancer 01/13/2017  . History of diverticulosis 01/13/2017  . Family history of ankylosing spondylitis 01/13/2017  . Other fatigue 01/13/2017  . HLA B27 (HLA B27 positive) 01/13/2017  . Opioid use disorder, severe, dependence ([place]) 04/22/2016  . History of colonic polyps   . Diverticulosis of colon without hemorrhage     Past Medical History:  Diagnosis Date  . Arthritis   . Cancer [facility])    prostate  . Hypertension   . Spondylolysis    all over     Family History  Problem Relation Age of Onset  . Anesthesia problems Neg Hx   . Hypotension Neg Hx   . Malignant hyperthermia Neg Hx     . Pseudochol deficiency Neg Hx    Past Surgical History:  Procedure Laterality Date  . CATARACT EXTRACTION W/PHACO  07/26/2011   Procedure: CATARACT EXTRACTION PHACO AND INTRAOCULAR LENS PLACEMENT (IOC);  Surgeon: Angel Lewis;  Location: [facility] ORS;  Service: Ophthalmology;  Laterality: Right;  CDE:10.88  . COLONOSCOPY    . COLONOSCOPY N/A 11/04/2014   Procedure: COLONOSCOPY;  Surgeon: Angel Dolin, MD;  Location: [facility] END[facility];  Service: Endoscopy;  Laterality: N/A;  7:30 AM  . KNEE ARTHROSCOPY  1985   left  . PROSTATECTOMY  02/23/2005 Angel Lewis   radical prostatectomy  . TONSILLECTOMY     as child   Social History   Social History Narrative  . No narrative on file     Objective: Vital Signs: There were no vitals taken for this visit.   Physical Exam   Musculoskeletal Exam: ***  CDAI Exam: No CDAI exam completed.    Investigation: Findings:  12/23/2016 HLA-B27 antigen positive, Serum protein electrophoresis with reflex  A poorly defined band of restricted protein mobility is detected in the gamma globulins. It is unlikely this may represent a monoclonal Protein, IFE Interpretation  Normal; CK normal,  Cyclic citrul peptide antibody, IgG normal, Hepatitis B core antibody, IgM negative, Hepatitis B surface antigen negative, Hepatitis C antibody negative, HIV antibody non reactive, IgG, IgA, IgM normal, Quantiferon tb gold assay (blood) normal,  Rheumatoid factor normal, and Sedimentation rate normal       Imaging: Xr Hip Unilat W Or W/o Pelvis 2-3 Views Left  Result Date: 12/23/2016 Left hip joint space appears normal, left SI joint is fused Normal left hip joint. Fused left SI joint  Xr Hip Unilat W Or W/o Pelvis 2-3 Views Right  Result Date: 12/23/2016 Right superior lateral narrowing was noted right SI joint appears fused Moderate arthritis of the right hip joint.  Xr Thoracic Spine 2 View  Result Date: 12/23/2016 Few syndesmophytes were noted in the thoracic  region. Mild multilevel spondylosis was noted.  Xr Hand 2 View Left  Result Date: 12/23/2016 PIP/DIP narrowing no MCP joint narrowing or erosive changes were noted no intercarpal joint space narrowing or erosive changes were noted. These findings were consistent with mild osteoarthritis  Xr Hand 2 View Right  Result Date: 12/23/2016 PIP/DIP narrowing no MCP joint narrowing or erosive changes were noted no intercarpal joint space narrowing or erosive changes were noted. These findings were consistent with mild osteoarthritis  Xr Knee 3 View Left  Result Date: 12/23/2016 Severe medial compartment narrowing was noted. Mild patellofemoral narrowing was noted. No chondrocalcinosis was noted. Findings are consistent with severe osteoarthritis and mild chondromalacia patella  Xr Knee 3 View Right  Result Date: 12/23/2016 Moderate medial compartment narrowing was noted. Intercondylar osteophytes were noted. No chondrocalcinosis was noted. Mild patellofemoral narrowing was noted. Impression moderate osteoarthritis and mild chondromalacia patella  Xr Lumbar Spine 2-3 Views  Result Date: 12/23/2016 Anterior osteophytes were noted. Facet joint arthropathy was noted. Some squaring of vertebrae were noted. Mild is spondylosis and facet joint arthropathy was noted   Speciality Comments: No specialty comments available.    Procedures:  No procedures performed Allergies: Percodan [oxycodone-aspirin]   Assessment / Plan:     Visit Diagnoses: Ankylosing spondylitis of lumbosacral region ([facility])  HLA B27 (HLA B27 positive)  History of iritis  Pain in thoracic spine  Chronic bilateral low back pain without sciatica  Bilateral plantar fasciitis  Other fatigue  Pain in both hands  Hip pain, bilateral  Chronic pain of both knees  Family history of ankylosing spondylitis  History of colonic polyps  History of diverticulosis  History of prostate cancer  Opioid use disorder, severe,  dependence ([place])    Orders: No orders of the defined types were placed in this encounter.  No orders of the defined types were placed in this encounter.   Face-to-face time spent with patient was *** minutes. 50% of time was spent in counseling and coordination of care.  Follow-Up Instructions: No Follow-up on file.   Bo Merino, MD  Note - This record has been created using Editor, commissioning.  Chart creation errors have been sought, but may not always  have been located. Such creation errors do not reflect on  the standard of medical care.

## 2017-01-18 ENCOUNTER — Ambulatory Visit: Payer: Self-pay | Admitting: Rheumatology

## 2017-01-19 ENCOUNTER — Ambulatory Visit (HOSPITAL_COMMUNITY): Admission: RE | Admit: 2017-01-19 | Payer: Medicare Other | Source: Ambulatory Visit

## 2017-02-09 ENCOUNTER — Encounter: Payer: Self-pay | Admitting: Rheumatology

## 2017-02-09 NOTE — Telephone Encounter (Signed)
He should make an appointment in January so we can apply for MRI again.

## 2017-05-12 DIAGNOSIS — Z23 Encounter for immunization: Secondary | ICD-10-CM | POA: Diagnosis not present

## 2017-07-20 DIAGNOSIS — H26491 Other secondary cataract, right eye: Secondary | ICD-10-CM | POA: Diagnosis not present

## 2017-07-20 DIAGNOSIS — H43811 Vitreous degeneration, right eye: Secondary | ICD-10-CM | POA: Diagnosis not present

## 2017-07-20 DIAGNOSIS — Z961 Presence of intraocular lens: Secondary | ICD-10-CM | POA: Diagnosis not present

## 2017-08-15 ENCOUNTER — Ambulatory Visit: Payer: PPO | Admitting: Orthopedic Surgery

## 2017-08-15 ENCOUNTER — Encounter: Payer: Self-pay | Admitting: Orthopedic Surgery

## 2017-08-15 VITALS — BP 158/89 | HR 64 | Ht 67.0 in | Wt 206.0 lb

## 2017-08-15 DIAGNOSIS — M25562 Pain in left knee: Secondary | ICD-10-CM

## 2017-08-15 DIAGNOSIS — M457 Ankylosing spondylitis of lumbosacral region: Secondary | ICD-10-CM
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
## 2017-10-03 DIAGNOSIS — E785 Hyperlipidemia, unspecified: Secondary | ICD-10-CM | POA: Diagnosis not present

## 2017-10-03 DIAGNOSIS — Z1389 Encounter for screening for other disorder: Secondary | ICD-10-CM | POA: Diagnosis not present

## 2017-10-03 DIAGNOSIS — E6609 Other obesity due to excess calories: Secondary | ICD-10-CM | POA: Diagnosis not present

## 2017-10-03 DIAGNOSIS — Z681 Body mass index (BMI) 19 or less, adult: Secondary | ICD-10-CM | POA: Diagnosis not present

## 2017-10-03 DIAGNOSIS — Z0000 Encounter for general adult medical examination without abnormal findings: Secondary | ICD-10-CM | POA: Diagnosis not present

## 2017-10-03 DIAGNOSIS — Z6831 Body mass index (BMI) 31.0-31.9, adult: Secondary | ICD-10-CM | POA: Diagnosis not present

## 2018-02-03 DIAGNOSIS — Z87440 Personal history of urinary (tract) infections: Secondary | ICD-10-CM | POA: Diagnosis not present

## 2018-02-03 DIAGNOSIS — N529 Male erectile dysfunction, unspecified: Secondary | ICD-10-CM | POA: Diagnosis not present

## 2018-02-03 DIAGNOSIS — R1030 Lower abdominal pain, unspecified: Secondary | ICD-10-CM | POA: Diagnosis not present

## 2018-02-03 DIAGNOSIS — Z9079 Acquired absence of other genital organ(s): Secondary | ICD-10-CM | POA: Diagnosis not present

## 2018-02-03 DIAGNOSIS — C61 Malignant neoplasm of prostate: Secondary | ICD-10-CM | POA: Diagnosis not present

## 2018-02-16 ENCOUNTER — Ambulatory Visit (HOSPITAL_COMMUNITY)
Admission: RE | Admit: 2018-02-16 | Discharge: 2018-02-16 | Disposition: A | Discharge status: Home / Self Care | Payer: PPO | Source: Ambulatory Visit | Attending: Physician Assistant | Admitting: Physician Assistant

## 2018-02-16 ENCOUNTER — Other Ambulatory Visit (HOSPITAL_COMMUNITY): Payer: Self-pay | Admitting: Physician Assistant

## 2018-02-16 DIAGNOSIS — Z1389 Encounter for screening for other disorder: Secondary | ICD-10-CM | POA: Diagnosis not present

## 2018-02-16 DIAGNOSIS — M25561 Pain in right knee: Secondary | ICD-10-CM | POA: Insufficient documentation

## 2018-02-16 DIAGNOSIS — M1711 Unilateral primary osteoarthritis, right knee: Secondary | ICD-10-CM | POA: Diagnosis not present

## 2018-02-16 DIAGNOSIS — M25551 Pain in right hip: Secondary | ICD-10-CM

## 2018-02-16 DIAGNOSIS — E6609 Other obesity due to excess calories: Secondary | ICD-10-CM | POA: Diagnosis not present

## 2018-02-16 DIAGNOSIS — Z6832 Body mass index (BMI) 32.0-32.9, adult: Secondary | ICD-10-CM | POA: Diagnosis not present

## 2018-02-20 ENCOUNTER — Telehealth: Payer: Self-pay | Admitting: Orthopedic Surgery

## 2018-02-20 NOTE — Telephone Encounter (Signed)
On 02/15/18, patient and wife called stating he needed to be seen by Dr. Aline Brochure.  I explained that Dr. Aline Brochure was out of the office.  I told him that it would be next week before he could be seen.  He was going to think about it.  On Friday, 02/17/18, I called pt's house and cell phone to give him an appointment with Dr. Aline Brochure on Monday but had to leave a message on both

## 2018-03-27 ENCOUNTER — Ambulatory Visit (HOSPITAL_COMMUNITY)
Admission: RE | Admit: 2018-03-27 | Discharge: 2018-03-27 | Disposition: A | Discharge status: Home / Self Care | Payer: PPO | Source: Ambulatory Visit | Attending: Family Medicine | Admitting: Family Medicine

## 2018-03-27 ENCOUNTER — Other Ambulatory Visit (HOSPITAL_COMMUNITY): Payer: Self-pay | Admitting: Family Medicine

## 2018-03-27 DIAGNOSIS — R0981 Nasal congestion: Secondary | ICD-10-CM | POA: Diagnosis not present

## 2018-03-27 DIAGNOSIS — E6609 Other obesity due to excess calories: Secondary | ICD-10-CM | POA: Diagnosis not present

## 2018-03-27 DIAGNOSIS — R059 Cough, unspecified: Secondary | ICD-10-CM

## 2018-03-27 DIAGNOSIS — R05 Cough: Secondary | ICD-10-CM | POA: Diagnosis not present

## 2018-03-27 DIAGNOSIS — Z6832 Body mass index (BMI) 32.0-32.9, adult: Secondary | ICD-10-CM | POA: Diagnosis not present

## 2018-03-27 DIAGNOSIS — R0989 Other specified symptoms and signs involving the circulatory and respiratory systems: Secondary | ICD-10-CM | POA: Insufficient documentation

## 2018-04-19 DIAGNOSIS — M25551 Pain in right hip: Secondary | ICD-10-CM | POA: Diagnosis not present

## 2018-04-19 DIAGNOSIS — M1611 Unilateral primary osteoarthritis, right hip: Secondary | ICD-10-CM | POA: Diagnosis not present

## 2018-04-19 DIAGNOSIS — M25561 Pain in right knee: Secondary | ICD-10-CM | POA: Diagnosis not present

## 2018-04-27 DIAGNOSIS — M1611 Unilateral primary osteoarthritis, right hip: Secondary | ICD-10-CM | POA: Diagnosis not present

## 2018-04-27 DIAGNOSIS — Z6831 Body mass index (BMI) 31.0-31.9, adult: Secondary | ICD-10-CM | POA: Diagnosis not present

## 2018-04-27 DIAGNOSIS — E6609 Other obesity due to excess calories: Secondary | ICD-10-CM | POA: Diagnosis not present

## 2018-04-27 DIAGNOSIS — Z23 Encounter for immunization: Secondary | ICD-10-CM | POA: Diagnosis not present

## 2018-05-01 DIAGNOSIS — E782 Mixed hyperlipidemia: Secondary | ICD-10-CM | POA: Diagnosis not present

## 2018-05-01 DIAGNOSIS — E6609 Other obesity due to excess calories: Secondary | ICD-10-CM | POA: Diagnosis not present

## 2018-05-01 DIAGNOSIS — I10 Essential (primary) hypertension: Secondary | ICD-10-CM | POA: Diagnosis not present

## 2018-05-01 DIAGNOSIS — R7309 Other abnormal glucose: Secondary | ICD-10-CM | POA: Diagnosis not present

## 2018-05-01 DIAGNOSIS — Z6831 Body mass index (BMI) 31.0-31.9, adult: Secondary | ICD-10-CM | POA: Diagnosis not present

## 2018-05-12 NOTE — H&P (Signed)
TOTAL HIP ADMISSION H&P  Patient is admitted for right total hip arthroplasty, anterior approach.  Subjective:  Chief Complaint:   Right hip primary OA / pain  HPI: Angel Lewis, 67 y.o. male, has a history of pain and functional disability in the right hip(s) due to arthritis and patient has failed non-surgical conservative treatments for greater than 12 weeks to include NSAID's and/or analgesics, corticosteriod injections, use of assistive devices and activity modification.  Onset of symptoms was gradual starting 6+ months ago with rapidlly worsening course since that time.The patient noted no past surgery on the right hip(s).  Patient currently rates pain in the right hip at 10 out of 10 with activity. Patient has worsening of pain with activity and weight bearing, trendelenberg gait, pain that interfers with activities of daily living and pain with passive range of motion. Patient has evidence of periarticular osteophytes and joint space narrowing by imaging studies. This condition presents safety issues increasing the risk of falls.   There is no current active infection.  Risks, benefits and expectations were discussed with the patient.  Risks including but not limited to the risk of anesthesia, blood clots, nerve damage, blood vessel damage, failure of the prosthesis, infection and up to and including death.  Patient understand the risks, benefits and expectations and wishes to proceed with surgery.   PCP: Sharilyn Sites, MD  D/C Plans:       Home   Post-op Meds:       No Rx given   Tranexamic Acid:      To be given - IV   Decadron:      Is to be given  FYI:      ASA  Norco  DME:   Rx given for - RW   PT:   No PT    Patient Active Problem List   Diagnosis Date Noted  . Ankylosing spondylitis of lumbosacral region ([place]) 01/13/2017  . Pain in thoracic spine 01/13/2017  . Chronic bilateral low back pain without sciatica 01/13/2017  . Pain in both hands 01/13/2017  . Hip pain,  bilateral 01/13/2017  . Chronic pain of both knees 01/13/2017  . Bilateral plantar fasciitis 01/13/2017  . History of iritis 01/13/2017  . History of prostate cancer 01/13/2017  . History of diverticulosis 01/13/2017  . Family history of ankylosing spondylitis 01/13/2017  . Other fatigue 01/13/2017  . HLA B27 (HLA B27 positive) 01/13/2017  . Opioid use disorder, severe, dependence ([place]) 04/22/2016  . History of colonic polyps   . Diverticulosis of colon without hemorrhage    Past Medical History:  Diagnosis Date  . Arthritis   . Cancer [facility])    prostate  . Hypertension   . Spondylolysis    all over     Past Surgical History:  Procedure Laterality Date  . CATARACT EXTRACTION W/PHACO  07/26/2011   Procedure: CATARACT EXTRACTION PHACO AND INTRAOCULAR LENS PLACEMENT (IOC);  Surgeon: Tonny Branch;  Location: [facility];  Service: Ophthalmology;  Laterality: Right;  CDE:10.88  . COLONOSCOPY    . COLONOSCOPY N/A 11/04/2014   Procedure: COLONOSCOPY;  Surgeon: Daneil Dolin, MD;  Location[facility];  Service: Endoscopy;  Laterality: N/A;  7:30 AM  . KNEE ARTHROSCOPY  1985   left  . PROSTATECTOMY  02/23/2005 [name]   radical prostatectomy  . TONSILLECTOMY     as child    No current facility-administered medications for this encounter.    Current Outpatient Medications  Medication Sig  Dispense Refill Last Dose  . ALPRAZolam (XANAX) 0.5 MG tablet Take 0.5 mg by mouth at bedtime as needed for anxiety.   Not Taking  . amLODipine-benazepril (LOTREL) 10-40 MG per capsule Take 1 capsule by mouth daily.   Taking  . aspirin 81 MG tablet Take 162 mg by mouth at bedtime.    Taking  . baclofen (LIORESAL) 10 MG tablet   0 Taking  . buprenorphine-naloxone (SUBOXONE) 8-2 mg SUBL SL tablet Place 1 tablet under the tongue daily.   Taking  . cholecalciferol (VITAMIN D) 1000 UNITS tablet Take 1,000 Units by mouth daily.     Taking  . citalopram (CELEXA) 10 MG tablet Take 10 mg by mouth daily.    Not Taking  . cloNIDine (CATAPRES) 0.2 MG tablet Take 1 tablet (0.2 mg total) by mouth 2 (two) times daily. (Patient not taking: Reported on 08/15/2017) 14 tablet 0 Not Taking  . Coenzyme Q10 (CO Q-10) 100 MG CAPS Take 1 capsule by mouth daily.    Taking  . DULoxetine (CYMBALTA) 30 MG capsule    Taking  . Garlic 7902 MG CAPS Take 1 capsule by mouth daily.    Taking  . LYRICA 150 MG capsule    Taking  . MAGNESIUM PO Take 1 tablet by mouth daily.   Taking  . Milk Thistle 250 MG CAPS Take 2-3 capsules by mouth daily.    Taking  . mometasone (NASONEX) 50 MCG/ACT nasal spray Place 2 sprays into the nose daily as needed. For congestion    Taking   Allergies  Allergen Reactions  . Percodan [Oxycodone-Aspirin] Itching    Social History   Tobacco Use  . Smoking status: Current Every Day Smoker    Packs/day: 0.25    Years: 30.00    Pack years: 7.50    Types: Cigarettes  . Smokeless tobacco: Never Used  Substance Use Topics  . Alcohol use: Yes    Comment: rarely    Family History  Problem Relation Age of Onset  . Anesthesia problems Neg Hx   . Hypotension Neg Hx   . Malignant hyperthermia Neg Hx   . Pseudochol deficiency Neg Hx      Review of Systems  Constitutional: Negative.   HENT: Positive for hearing loss.   Eyes: Negative.   Respiratory: Negative.   Cardiovascular: Negative.   Gastrointestinal: Negative.   Genitourinary: Negative.   Musculoskeletal: Positive for joint pain.  Skin: Negative.   Neurological: Negative.   Endo/Heme/Allergies: Negative.   Psychiatric/Behavioral: Negative.     Objective:  Physical Exam  Constitutional: He is oriented to person, place, and time. He appears well-developed.  HENT:  Head: Normocephalic.  Eyes: Pupils are equal, round, and reactive to light.  Neck: Neck supple. No JVD present. No tracheal deviation present. No thyromegaly present.  Cardiovascular: Normal rate, regular rhythm and intact distal pulses.  Respiratory: Effort  normal and breath sounds normal. No respiratory distress. He has no wheezes.  GI: Soft. There is no tenderness. There is no guarding.  Musculoskeletal:       Right hip: He exhibits decreased range of motion, decreased strength, tenderness and bony tenderness. He exhibits no swelling, no deformity and no laceration.  Lymphadenopathy:    He has no cervical adenopathy.  Neurological: He is alert and oriented to person, place, and time.  Skin: Skin is warm and dry.  Psychiatric: He has a normal mood and affect.     Labs:  Estimated body mass index is 32.26  kg/m as calculated from the following:   Height as of 08/15/17: 5' 7"  (1.702 m).   Weight as of 08/15/17: 93.4 kg.   Imaging Review Plain radiographs demonstrate severe degenerative joint disease of the right hip. The bone quality appears to be good for age and reported activity level.    Preoperative templating of the joint replacement has been completed, documented, and submitted to the Operating Room personnel in order to optimize intra-operative equipment management.     Assessment/Plan:  End stage arthritis, right hip  The patient history, physical examination, clinical judgement of the provider and imaging studies are consistent with end stage degenerative joint disease of the right hip and total hip arthroplasty is deemed medically necessary. The treatment options including medical management, injection therapy, arthroscopy and arthroplasty were discussed at length. The risks and benefits of total hip arthroplasty were presented and reviewed. The risks due to aseptic loosening, infection, stiffness, dislocation/subluxation,  thromboembolic complications and other imponderables were discussed.  The patient acknowledged the explanation, agreed to proceed with the plan and consent was signed. Patient is being admitted for inpatient treatment for surgery, pain control, PT, OT, prophylactic antibiotics, VTE prophylaxis, progressive  ambulation and ADL's and discharge planning.The patient is planning to be discharged home.   [place] Pugh Harlon Kutner   PA-C  05/12/2018, 9:36 AM

## 2018-05-17 ENCOUNTER — Other Ambulatory Visit (HOSPITAL_COMMUNITY): Payer: Self-pay | Admitting: Anesthesiology

## 2018-05-17 NOTE — Patient Instructions (Signed)
Angel Lewis  05/17/2018   Your procedure is scheduled on: Tuesday 05/23/2018  Report to [facility] Main  Entrance              Report to admitting at  1150 AM    Call this number if you have problems the morning of surgery [phone]    Remember: Do not eat food  :After Midnight. May have clear liquids from midnight up until 0820 am then nothing until after surgery!    CLEAR LIQUID DIET   Foods Allowed                                                                     Foods Excluded  Coffee and tea, regular and decaf                             liquids that you cannot  Plain Jell-O in any flavor                                             see through such as: Fruit ices (not with fruit pulp)                                     milk, soups, orange juice  Iced Popsicles                                    All solid food Carbonated beverages, regular and diet                                    Cranberry, grape and apple juices Sports drinks like Gatorade Lightly seasoned clear broth or consume(fat free) Sugar, honey syrup  Sample Menu Breakfast                                Lunch                                     Supper Cranberry juice                    Beef broth                            Chicken broth Jell-O                                     Grape juice  Apple juice Coffee or tea                        Jell-O                                      Popsicle                                                Coffee or tea                        Coffee or tea  _____________________________________________________________________                BRUSH YOUR TEETH MORNING OF SURGERY AND RINSE YOUR MOUTH OUT, NO CHEWING GUM CANDY OR MINTS.     Take these medicines the morning of surgery with A SIP OF WATER: Duloxetine (Cymbalta)                                You may not have any metal on your body including hair pins and              piercings  Do not wear jewelry, make-up, lotions, powders or perfumes, deodorant                         Men may shave face and neck.   Do not bring valuables to the hospital. [place].  Contacts, dentures or bridgework may not be worn into surgery.  Leave suitcase in the car. After surgery it may be brought to your room.                  Please read over the following fact sheets you were given: _____________________________________________________________________             [facility] - Preparing for Surgery Before surgery, you can play an important role.  Because skin is not sterile, your skin needs to be as free of germs as possible.  You can reduce the number of germs on your skin by washing with CHG (chlorahexidine gluconate) soap before surgery.  CHG is an antiseptic cleaner which kills germs and bonds with the skin to continue killing germs even after washing. Please DO NOT use if you have an allergy to CHG or antibacterial soaps.  If your skin becomes reddened/irritated stop using the CHG and inform your nurse when you arrive at Short Stay. Do not shave (including legs and underarms) for at least 48 hours prior to the first CHG shower.  You may shave your face/neck. Please follow these instructions carefully:  1.  Shower with CHG Soap the night before surgery and the  morning of Surgery.  2.  If you choose to wash your hair, wash your hair first as usual with your  normal  shampoo.  3.  After you shampoo, rinse your hair and body thoroughly to remove the  shampoo.  4.  Use CHG as you would any other liquid soap.  You can apply chg directly  to the skin and wash                       Gently with a scrungie or clean washcloth.  5.  Apply the CHG Soap to your body ONLY FROM THE NECK DOWN.   Do not use on face/ open                           Wound or open sores. Avoid contact with eyes, ears mouth  and genitals (private parts).                       Wash face,  Genitals (private parts) with your normal soap.             6.  Wash thoroughly, paying special attention to the area where your surgery  will be performed.  7.  Thoroughly rinse your body with warm water from the neck down.  8.  DO NOT shower/wash with your normal soap after using and rinsing off  the CHG Soap.                9.  Pat yourself dry with a clean towel.            10.  Wear clean pajamas.            11.  Place clean sheets on your bed the night of your first shower and do not  sleep with pets. Day of Surgery : Do not apply any lotions/deodorants the morning of surgery.  Please wear clean clothes to the hospital/surgery center.  FAILURE TO FOLLOW THESE INSTRUCTIONS MAY RESULT IN THE CANCELLATION OF YOUR SURGERY PATIENT SIGNATURE_________________________________  NURSE SIGNATURE__________________________________  ________________________________________________________________________   Angel Lewis  An incentive spirometer is a tool that can help keep your lungs clear and active. This tool measures how well you are filling your lungs with each breath. Taking long deep breaths may help reverse or decrease the chance of developing breathing (pulmonary) problems (especially infection) following:  A long period of time when you are unable to move or be active. BEFORE THE PROCEDURE   If the spirometer includes an indicator to show your best effort, your nurse or respiratory therapist will set it to a desired goal.  If possible, sit up straight or lean slightly forward. Try not to slouch.  Hold the incentive spirometer in an upright position. INSTRUCTIONS FOR USE  1. Sit on the edge of your bed if possible, or sit up as far as you can in bed or on a chair. 2. Hold the incentive spirometer in an upright position. 3. Breathe out normally. 4. Place the mouthpiece in your mouth and seal your lips tightly  around it. 5. Breathe in slowly and as deeply as possible, raising the piston or the ball toward the top of the column. 6. Hold your breath for 3-5 seconds or for as long as possible. Allow the piston or ball to fall to the bottom of the column. 7. Remove the mouthpiece from your mouth and breathe out normally. 8. Rest for a few seconds and repeat Steps 1 through 7 at least 10 times every 1-2 hours when you are awake. Take your time and take a few normal breaths between deep breaths. 9. The spirometer may include an indicator to  show your best effort. Use the indicator as a goal to work toward during each repetition. 10. After each set of 10 deep breaths, practice coughing to be sure your lungs are clear. If you have an incision (the cut made at the time of surgery), support your incision when coughing by placing a pillow or rolled up towels firmly against it. Once you are able to get out of bed, walk around indoors and cough well. You may stop using the incentive spirometer when instructed by your caregiver.  RISKS AND COMPLICATIONS  Take your time so you do not get dizzy or light-headed.  If you are in pain, you may need to take or ask for pain medication before doing incentive spirometry. It is harder to take a deep breath if you are having pain. AFTER USE  Rest and breathe slowly and easily.  It can be helpful to keep track of a log of your progress. Your caregiver can provide you with a simple table to help with this. If you are using the spirometer at home, follow these instructions: [place] IF:   You are having difficultly using the spirometer.  You have trouble using the spirometer as often as instructed.  Your pain medication is not giving enough relief while using the spirometer.  You develop fever of 100.5 F (38.1 C) or higher. SEEK IMMEDIATE MEDICAL CARE IF:   You cough up bloody sputum that had not been present before.  You develop fever of 102 F (38.9 C) or  greater.  You develop worsening pain at or near the incision site. MAKE SURE YOU:   Understand these instructions.  Will watch your condition.  Will get help right away if you are not doing well or get worse. Document Released: 11/08/2006 Document Revised: 09/20/2011 Document Reviewed: 01/09/2007 Exit[employer] Patient Information 2014 Exit[employer], [place].   ________________________________________________________________________  WHAT IS A BLOOD TRANSFUSION? Blood Transfusion Information  A transfusion is the replacement of blood or some of its parts. Blood is made up of multiple cells which provide different functions.  Red blood cells carry oxygen and are used for blood loss replacement.  White blood cells fight against infection.  Platelets control bleeding.  Plasma helps clot blood.  Other blood products are available for specialized needs, such as hemophilia or other clotting disorders. BEFORE THE TRANSFUSION  Who gives blood for transfusions?   Healthy volunteers who are fully evaluated to make sure their blood is safe. This is blood bank blood. Transfusion therapy is the safest it has ever been in the practice of medicine. Before blood is taken from a donor, a complete history is taken to make sure that person has no history of diseases nor engages in risky social behavior (examples are intravenous drug use or sexual activity with multiple partners). The donor's travel history is screened to minimize risk of transmitting infections, such as malaria. The donated blood is tested for signs of infectious diseases, such as HIV and hepatitis. The blood is then tested to be sure it is compatible with you in order to minimize the chance of a transfusion reaction. If you or a relative donates blood, this is often done in anticipation of surgery and is not appropriate for emergency situations. It takes many days to process the donated blood. RISKS AND COMPLICATIONS Although transfusion therapy  is very safe and saves many lives, the main dangers of transfusion include:   Getting an infectious disease.  Developing a transfusion reaction. This is an allergic reaction  to something in the blood you were given. Every precaution is taken to prevent this. The decision to have a blood transfusion has been considered carefully by your caregiver before blood is given. Blood is not given unless the benefits outweigh the risks. AFTER THE TRANSFUSION  Right after receiving a blood transfusion, you will usually feel much better and more energetic. This is especially true if your red blood cells have gotten low (anemic). The transfusion raises the level of the red blood cells which carry oxygen, and this usually causes an energy increase.  The nurse administering the transfusion will monitor you carefully for complications. HOME CARE INSTRUCTIONS  No special instructions are needed after a transfusion. You may find your energy is better. Speak with your caregiver about any limitations on activity for underlying diseases you may have. SEEK MEDICAL CARE IF:   Your condition is not improving after your transfusion.  You develop redness or irritation at the intravenous (IV) site. SEEK IMMEDIATE MEDICAL CARE IF:  Any of the following symptoms occur over the next 12 hours:  Shaking chills.  You have a temperature by mouth above 102 F (38.9 C), not controlled by medicine.  Chest, back, or muscle pain.  People around you feel you are not acting correctly or are confused.  Shortness of breath or difficulty breathing.  Dizziness and fainting.  You get a rash or develop hives.  You have a decrease in urine output.  Your urine turns a dark color or changes to pink, red, or brown. Any of the following symptoms occur over the next 10 days:  You have a temperature by mouth above 102 F (38.9 C), not controlled by medicine.  Shortness of breath.  Weakness after normal activity.  The white  part of the eye turns yellow (jaundice).  You have a decrease in the amount of urine or are urinating less often.  Your urine turns a dark color or changes to pink, red, or brown. Document Released: 06/25/2000 Document Revised: 09/20/2011 Document Reviewed: 02/12/2008 [facility] Patient Information 2014 [place], [place].  _______________________________________________________________________

## 2018-05-18 ENCOUNTER — Encounter (HOSPITAL_COMMUNITY): Payer: Self-pay

## 2018-05-18 ENCOUNTER — Encounter (HOSPITAL_COMMUNITY)
Admission: RE | Admit: 2018-05-18 | Discharge: 2018-05-18 | Disposition: A | Discharge status: Home / Self Care | Payer: PPO | Source: Ambulatory Visit | Attending: Orthopedic Surgery | Admitting: Orthopedic Surgery

## 2018-05-18 ENCOUNTER — Other Ambulatory Visit: Payer: Self-pay

## 2018-05-18 DIAGNOSIS — R001 Bradycardia, unspecified: Secondary | ICD-10-CM | POA: Insufficient documentation

## 2018-05-18 DIAGNOSIS — Z01818 Encounter for other preprocedural examination: Secondary | ICD-10-CM | POA: Diagnosis not present

## 2018-05-18 DIAGNOSIS — I10 Essential (primary) hypertension: Secondary | ICD-10-CM | POA: Diagnosis not present

## 2018-05-18 LAB — SURGICAL PCR SCREEN
MRSA, PCR: NEGATIVE
Staphylococcus aureus: NEGATIVE

## 2018-05-18 NOTE — Progress Notes (Signed)
05/01/2018-Labs  on chart from [place], CBC w/diff.,CMP,, HgA1C,Lipid Panel

## 2018-05-19 LAB — ABO/RH: ABO/RH(D): A NEG

## 2018-05-23 ENCOUNTER — Inpatient Hospital Stay (HOSPITAL_COMMUNITY): Payer: PPO

## 2018-05-23 ENCOUNTER — Encounter (HOSPITAL_COMMUNITY): Payer: Self-pay | Admitting: *Deleted

## 2018-05-23 ENCOUNTER — Inpatient Hospital Stay (HOSPITAL_COMMUNITY): Payer: PPO | Admitting: Anesthesiology

## 2018-05-23 ENCOUNTER — Inpatient Hospital Stay (HOSPITAL_COMMUNITY)
Admission: RE | Admit: 2018-05-23 | Discharge: 2018-05-24 | DRG: 470 | Disposition: A | Discharge status: Home / Self Care | Payer: PPO | Attending: Orthopedic Surgery | Admitting: Orthopedic Surgery

## 2018-05-23 ENCOUNTER — Encounter (HOSPITAL_COMMUNITY)
Admission: RE | Disposition: A | Discharge status: Home / Self Care | Payer: Self-pay | Source: Home / Self Care | Attending: Orthopedic Surgery

## 2018-05-23 ENCOUNTER — Other Ambulatory Visit: Payer: Self-pay

## 2018-05-23 DIAGNOSIS — H9190 Unspecified hearing loss, unspecified ear: Secondary | ICD-10-CM | POA: Diagnosis not present

## 2018-05-23 DIAGNOSIS — Z885 Allergy status to narcotic agent status: Secondary | ICD-10-CM | POA: Diagnosis not present

## 2018-05-23 DIAGNOSIS — Z6831 Body mass index (BMI) 31.0-31.9, adult: Secondary | ICD-10-CM | POA: Diagnosis not present

## 2018-05-23 DIAGNOSIS — Z9841 Cataract extraction status, right eye: Secondary | ICD-10-CM

## 2018-05-23 DIAGNOSIS — M1611 Unilateral primary osteoarthritis, right hip: Principal | ICD-10-CM | POA: Diagnosis present

## 2018-05-23 DIAGNOSIS — Z961 Presence of intraocular lens: Secondary | ICD-10-CM | POA: Diagnosis present

## 2018-05-23 DIAGNOSIS — Z9079 Acquired absence of other genital organ(s): Secondary | ICD-10-CM

## 2018-05-23 DIAGNOSIS — Z8719 Personal history of other diseases of the digestive system: Secondary | ICD-10-CM

## 2018-05-23 DIAGNOSIS — F1120 Opioid dependence, uncomplicated: Secondary | ICD-10-CM | POA: Diagnosis not present

## 2018-05-23 DIAGNOSIS — I10 Essential (primary) hypertension: Secondary | ICD-10-CM | POA: Diagnosis not present

## 2018-05-23 DIAGNOSIS — Z7982 Long term (current) use of aspirin: Secondary | ICD-10-CM

## 2018-05-23 DIAGNOSIS — Z8546 Personal history of malignant neoplasm of prostate: Secondary | ICD-10-CM

## 2018-05-23 DIAGNOSIS — M25559 Pain in unspecified hip: Secondary | ICD-10-CM | POA: Diagnosis not present

## 2018-05-23 DIAGNOSIS — Z419 Encounter for procedure for purposes other than remedying health state, unspecified: Secondary | ICD-10-CM

## 2018-05-23 DIAGNOSIS — F17210 Nicotine dependence, cigarettes, uncomplicated: Secondary | ICD-10-CM | POA: Diagnosis present

## 2018-05-23 DIAGNOSIS — M4300 Spondylolysis, site unspecified: Secondary | ICD-10-CM | POA: Diagnosis not present

## 2018-05-23 DIAGNOSIS — Z79899 Other long term (current) drug therapy: Secondary | ICD-10-CM | POA: Diagnosis not present

## 2018-05-23 DIAGNOSIS — E669 Obesity, unspecified: Secondary | ICD-10-CM | POA: Diagnosis not present

## 2018-05-23 DIAGNOSIS — Z96641 Presence of right artificial hip joint: Secondary | ICD-10-CM

## 2018-05-23 DIAGNOSIS — Z96649 Presence of unspecified artificial hip joint: Secondary | ICD-10-CM

## 2018-05-23 DIAGNOSIS — Z471 Aftercare following joint replacement surgery: Secondary | ICD-10-CM | POA: Diagnosis not present

## 2018-05-23 HISTORY — PX: TOTAL HIP ARTHROPLASTY: SHX124

## 2018-05-23 LAB — TYPE AND SCREEN
ABO/RH(D): A NEG
Antibody Screen: NEGATIVE

## 2018-05-23 SURGERY — ARTHROPLASTY, HIP, TOTAL, ANTERIOR APPROACH
Anesthesia: Spinal | Site: Hip | Laterality: Right

## 2018-05-23 MED ORDER — HYDROCODONE-ACETAMINOPHEN 7.5-325 MG PO TABS: 1.0000 | ORAL_TABLET | ORAL | 0 refills | Status: DC | PRN

## 2018-05-23 MED ORDER — HYDROCODONE-ACETAMINOPHEN 5-325 MG PO TABS
1.0000 | ORAL_TABLET | ORAL | Status: DC | PRN
  Administered 2018-05-23: 1 via ORAL
  Filled 2018-05-23: qty 2
  Filled 2018-05-23: qty 1

## 2018-05-23 MED ORDER — ALUM & MAG HYDROXIDE-SIMETH 200-200-20 MG/5ML PO SUSP: 15.0000 mL | ORAL | Status: DC | PRN

## 2018-05-23 MED ORDER — FENTANYL CITRATE (PF) 100 MCG/2ML IJ SOLN: 25.0000 ug | INTRAMUSCULAR | Status: DC | PRN

## 2018-05-23 MED ORDER — PROPOFOL 10 MG/ML IV BOLUS
INTRAVENOUS | Status: DC | PRN
  Administered 2018-05-23: 20 mg via INTRAVENOUS
  Administered 2018-05-23: 10 mg via INTRAVENOUS
  Administered 2018-05-23: 20 mg via INTRAVENOUS

## 2018-05-23 MED ORDER — BACLOFEN 10 MG PO TABS: 10.0000 mg | ORAL_TABLET | Freq: Three times a day (TID) | ORAL | 0 refills | Status: DC | PRN

## 2018-05-23 MED ORDER — DIPHENHYDRAMINE HCL 12.5 MG/5ML PO ELIX: 12.5000 mg | ORAL_SOLUTION | ORAL | Status: DC | PRN

## 2018-05-23 MED ORDER — PROPOFOL 10 MG/ML IV BOLUS
INTRAVENOUS | Status: AC
  Filled 2018-05-23: qty 80

## 2018-05-23 MED ORDER — LIDOCAINE HCL (CARDIAC) PF 100 MG/5ML IV SOSY
PREFILLED_SYRINGE | INTRAVENOUS | Status: DC | PRN
  Administered 2018-05-23: 60 mg via INTRAVENOUS

## 2018-05-23 MED ORDER — POLYVINYL ALCOHOL 1.4 % OP SOLN
1.0000 [drp] | Freq: Every day | OPHTHALMIC | Status: DC
  Administered 2018-05-23: 1 [drp] via OPHTHALMIC
  Filled 2018-05-23: qty 15

## 2018-05-23 MED ORDER — PHENYLEPHRINE 40 MCG/ML (10ML) SYRINGE FOR IV PUSH (FOR BLOOD PRESSURE SUPPORT)
PREFILLED_SYRINGE | INTRAVENOUS | Status: AC
  Filled 2018-05-23: qty 10

## 2018-05-23 MED ORDER — CEFAZOLIN SODIUM-DEXTROSE 2-4 GM/100ML-% IV SOLN
2.0000 g | Freq: Four times a day (QID) | INTRAVENOUS | Status: AC
  Administered 2018-05-23 – 2018-05-24 (×2): 2 g via INTRAVENOUS
  Filled 2018-05-23 (×2): qty 100

## 2018-05-23 MED ORDER — ASPIRIN 81 MG PO CHEW: 81.0000 mg | CHEWABLE_TABLET | Freq: Two times a day (BID) | ORAL | 0 refills | Status: AC

## 2018-05-23 MED ORDER — HYDROCODONE-ACETAMINOPHEN 7.5-325 MG PO TABS
1.0000 | ORAL_TABLET | ORAL | Status: DC | PRN
  Administered 2018-05-23 – 2018-05-24 (×4): 2 via ORAL
  Filled 2018-05-23 (×4): qty 2

## 2018-05-23 MED ORDER — DEXAMETHASONE SODIUM PHOSPHATE 10 MG/ML IJ SOLN
10.0000 mg | Freq: Once | INTRAMUSCULAR | Status: AC
  Administered 2018-05-24: 10 mg via INTRAVENOUS
  Filled 2018-05-23: qty 1

## 2018-05-23 MED ORDER — ONDANSETRON HCL 4 MG PO TABS: 4.0000 mg | ORAL_TABLET | Freq: Four times a day (QID) | ORAL | Status: DC | PRN

## 2018-05-23 MED ORDER — MAGNESIUM CITRATE PO SOLN: 1.0000 | Freq: Once | ORAL | Status: DC | PRN

## 2018-05-23 MED ORDER — PROPOFOL 10 MG/ML IV BOLUS
INTRAVENOUS | Status: AC
  Filled 2018-05-23: qty 20

## 2018-05-23 MED ORDER — TRANEXAMIC ACID-NACL 1000-0.7 MG/100ML-% IV SOLN
1000.0000 mg | INTRAVENOUS | Status: AC
  Administered 2018-05-23: 1000 mg via INTRAVENOUS
  Filled 2018-05-23: qty 100

## 2018-05-23 MED ORDER — CEFAZOLIN SODIUM-DEXTROSE 2-4 GM/100ML-% IV SOLN
2.0000 g | INTRAVENOUS | Status: AC
  Administered 2018-05-23: 2 g via INTRAVENOUS
  Filled 2018-05-23: qty 100

## 2018-05-23 MED ORDER — SODIUM CHLORIDE 0.9 % IV SOLN
INTRAVENOUS | Status: DC
  Administered 2018-05-23 – 2018-05-24 (×2): via INTRAVENOUS

## 2018-05-23 MED ORDER — CELECOXIB 200 MG PO CAPS
200.0000 mg | ORAL_CAPSULE | Freq: Two times a day (BID) | ORAL | Status: DC
  Administered 2018-05-24: 200 mg via ORAL
  Filled 2018-05-23 (×2): qty 1

## 2018-05-23 MED ORDER — DEXAMETHASONE SODIUM PHOSPHATE 10 MG/ML IJ SOLN
INTRAMUSCULAR | Status: DC | PRN
  Administered 2018-05-23: 10 mg via INTRAVENOUS

## 2018-05-23 MED ORDER — METHADONE HCL 10 MG/ML IJ SOLN
10.0000 mg | Freq: Once | INTRAMUSCULAR | Status: AC
  Administered 2018-05-23: 10 mg via INTRAVENOUS
  Filled 2018-05-23 (×2): qty 1

## 2018-05-23 MED ORDER — PROPOFOL 500 MG/50ML IV EMUL
INTRAVENOUS | Status: DC | PRN
  Administered 2018-05-23: 100 ug/kg/min via INTRAVENOUS

## 2018-05-23 MED ORDER — ASPIRIN 81 MG PO CHEW
81.0000 mg | CHEWABLE_TABLET | Freq: Two times a day (BID) | ORAL | Status: DC
  Administered 2018-05-23 – 2018-05-24 (×2): 81 mg via ORAL
  Filled 2018-05-23 (×2): qty 1

## 2018-05-23 MED ORDER — SODIUM CHLORIDE 0.9 % IV SOLN
INTRAVENOUS | Status: DC | PRN
  Administered 2018-05-23: 100 ug/min via INTRAVENOUS

## 2018-05-23 MED ORDER — DOCUSATE SODIUM 100 MG PO CAPS
100.0000 mg | ORAL_CAPSULE | Freq: Two times a day (BID) | ORAL | Status: DC
  Administered 2018-05-23 – 2018-05-24 (×2): 100 mg via ORAL
  Filled 2018-05-23 (×2): qty 1

## 2018-05-23 MED ORDER — FAMOTIDINE 20 MG PO TABS: 10.0000 mg | ORAL_TABLET | Freq: Every day | ORAL | Status: DC | PRN

## 2018-05-23 MED ORDER — HYDROMORPHONE HCL 1 MG/ML IJ SOLN
0.5000 mg | INTRAMUSCULAR | Status: DC | PRN
  Administered 2018-05-23: 1 mg via INTRAVENOUS
  Filled 2018-05-23: qty 1

## 2018-05-23 MED ORDER — ONDANSETRON HCL 4 MG/2ML IJ SOLN: 4.0000 mg | Freq: Four times a day (QID) | INTRAMUSCULAR | Status: DC | PRN

## 2018-05-23 MED ORDER — POLYETHYLENE GLYCOL 3350 17 G PO PACK: 17.0000 g | PACK | Freq: Two times a day (BID) | ORAL | 0 refills | Status: DC

## 2018-05-23 MED ORDER — STERILE WATER FOR IRRIGATION IR SOLN
Status: DC | PRN
  Administered 2018-05-23: 2000 mL

## 2018-05-23 MED ORDER — DOCUSATE SODIUM 100 MG PO CAPS: 100.0000 mg | ORAL_CAPSULE | Freq: Two times a day (BID) | ORAL | 0 refills | Status: AC

## 2018-05-23 MED ORDER — FERROUS SULFATE 325 (65 FE) MG PO TABS: 325.0000 mg | ORAL_TABLET | Freq: Three times a day (TID) | ORAL | 3 refills | Status: DC

## 2018-05-23 MED ORDER — METHOCARBAMOL 500 MG IVPB - SIMPLE MED
500.0000 mg | Freq: Four times a day (QID) | INTRAVENOUS | Status: DC | PRN
  Administered 2018-05-23: 500 mg via INTRAVENOUS
  Filled 2018-05-23: qty 50

## 2018-05-23 MED ORDER — POLYETHYLENE GLYCOL 3350 17 G PO PACK
17.0000 g | PACK | Freq: Two times a day (BID) | ORAL | Status: DC
  Administered 2018-05-24: 17 g via ORAL
  Filled 2018-05-23 (×2): qty 1

## 2018-05-23 MED ORDER — SODIUM CHLORIDE 0.9 % IR SOLN
Status: DC | PRN
  Administered 2018-05-23: 1000 mL

## 2018-05-23 MED ORDER — ONDANSETRON HCL 4 MG/2ML IJ SOLN
INTRAMUSCULAR | Status: DC | PRN
  Administered 2018-05-23: 4 mg via INTRAVENOUS

## 2018-05-23 MED ORDER — CHLORHEXIDINE GLUCONATE 4 % EX LIQD: 60.0000 mL | Freq: Once | CUTANEOUS | Status: DC

## 2018-05-23 MED ORDER — EPHEDRINE 5 MG/ML INJ
INTRAVENOUS | Status: AC
  Filled 2018-05-23: qty 10

## 2018-05-23 MED ORDER — METOCLOPRAMIDE HCL 5 MG PO TABS: 5.0000 mg | ORAL_TABLET | Freq: Three times a day (TID) | ORAL | Status: DC | PRN

## 2018-05-23 MED ORDER — MENTHOL 3 MG MT LOZG: 1.0000 | LOZENGE | OROMUCOSAL | Status: DC | PRN

## 2018-05-23 MED ORDER — DEXAMETHASONE SODIUM PHOSPHATE 10 MG/ML IJ SOLN
INTRAMUSCULAR | Status: AC
  Filled 2018-05-23: qty 1

## 2018-05-23 MED ORDER — METHOCARBAMOL 500 MG PO TABS
500.0000 mg | ORAL_TABLET | Freq: Four times a day (QID) | ORAL | Status: DC | PRN
  Administered 2018-05-23: 500 mg via ORAL
  Filled 2018-05-23: qty 1

## 2018-05-23 MED ORDER — DULOXETINE HCL 30 MG PO CPEP
30.0000 mg | ORAL_CAPSULE | Freq: Three times a day (TID) | ORAL | Status: DC
  Administered 2018-05-23 – 2018-05-24 (×2): 30 mg via ORAL
  Filled 2018-05-23 (×2): qty 1

## 2018-05-23 MED ORDER — LACTATED RINGERS IV SOLN
INTRAVENOUS | Status: DC
  Administered 2018-05-23 (×2): via INTRAVENOUS

## 2018-05-23 MED ORDER — BUPRENORPHINE HCL-NALOXONE HCL 8-2 MG SL SUBL
1.0000 | SUBLINGUAL_TABLET | Freq: Three times a day (TID) | SUBLINGUAL | Status: DC
  Administered 2018-05-23 – 2018-05-24 (×3): 1 via SUBLINGUAL
  Filled 2018-05-23 (×3): qty 1

## 2018-05-23 MED ORDER — DEXAMETHASONE SODIUM PHOSPHATE 10 MG/ML IJ SOLN: 10.0000 mg | Freq: Once | INTRAMUSCULAR | Status: DC

## 2018-05-23 MED ORDER — BUPIVACAINE IN DEXTROSE 0.75-8.25 % IT SOLN
INTRATHECAL | Status: DC | PRN
  Administered 2018-05-23: 2 mL via INTRATHECAL

## 2018-05-23 MED ORDER — FERROUS SULFATE 325 (65 FE) MG PO TABS
325.0000 mg | ORAL_TABLET | Freq: Three times a day (TID) | ORAL | Status: DC
  Administered 2018-05-23 – 2018-05-24 (×2): 325 mg via ORAL
  Filled 2018-05-23 (×2): qty 1

## 2018-05-23 MED ORDER — PHENOL 1.4 % MT LIQD
1.0000 | OROMUCOSAL | Status: DC | PRN
  Filled 2018-05-23: qty 177

## 2018-05-23 MED ORDER — ONDANSETRON HCL 4 MG/2ML IJ SOLN
INTRAMUSCULAR | Status: AC
  Filled 2018-05-23: qty 2

## 2018-05-23 MED ORDER — METOCLOPRAMIDE HCL 5 MG/ML IJ SOLN: 5.0000 mg | Freq: Three times a day (TID) | INTRAMUSCULAR | Status: DC | PRN

## 2018-05-23 MED ORDER — ACETAMINOPHEN 325 MG PO TABS: 325.0000 mg | ORAL_TABLET | Freq: Four times a day (QID) | ORAL | Status: DC | PRN

## 2018-05-23 MED ORDER — ONDANSETRON HCL 4 MG/2ML IJ SOLN: 4.0000 mg | Freq: Once | INTRAMUSCULAR | Status: DC | PRN

## 2018-05-23 MED ORDER — TRANEXAMIC ACID-NACL 1000-0.7 MG/100ML-% IV SOLN
1000.0000 mg | Freq: Once | INTRAVENOUS | Status: AC
  Administered 2018-05-23: 1000 mg via INTRAVENOUS
  Filled 2018-05-23: qty 100

## 2018-05-23 MED ORDER — CARBOXYMETHYLCELLUL-GLYCERIN 0.5-0.9 % OP SOLN: 1.0000 [drp] | Freq: Every day | OPHTHALMIC | Status: DC

## 2018-05-23 MED ORDER — LIDOCAINE 2% (20 MG/ML) 5 ML SYRINGE
INTRAMUSCULAR | Status: AC
  Filled 2018-05-23: qty 5

## 2018-05-23 MED ORDER — BISACODYL 10 MG RE SUPP: 10.0000 mg | Freq: Every day | RECTAL | Status: DC | PRN

## 2018-05-23 SURGICAL SUPPLY — 41 items
BAG DECANTER FOR FLEXI CONT (MISCELLANEOUS) IMPLANT
BAG ZIPLOCK 12X15 (MISCELLANEOUS) IMPLANT
BLADE SAG 18X100X1.27 (BLADE) ×3 IMPLANT
COVER PERINEAL POST (MISCELLANEOUS) ×3 IMPLANT
COVER SURGICAL LIGHT HANDLE (MISCELLANEOUS) ×3 IMPLANT
COVER WAND RF STERILE (DRAPES) ×3 IMPLANT
CUP ACETBLR 54 OD PINNACLE (Hips) ×3 IMPLANT
DERMABOND ADVANCED (GAUZE/BANDAGES/DRESSINGS) ×2
DERMABOND ADVANCED .7 DNX12 (GAUZE/BANDAGES/DRESSINGS) ×1 IMPLANT
DRAPE STERI IOBAN 125X83 (DRAPES) ×3 IMPLANT
DRAPE U-SHAPE 47X51 STRL (DRAPES) ×6 IMPLANT
DRESSING AQUACEL AG SP 3.5X10 (GAUZE/BANDAGES/DRESSINGS) ×1 IMPLANT
DRSG AQUACEL AG SP 3.5X10 (GAUZE/BANDAGES/DRESSINGS) ×3
DURAPREP 26ML APPLICATOR (WOUND CARE) ×3 IMPLANT
ELECT REM PT RETURN 15FT ADLT (MISCELLANEOUS) ×3 IMPLANT
ELIMINATOR HOLE APEX DEPUY (Hips) ×3 IMPLANT
GLOVE BIOGEL M STRL SZ7.5 (GLOVE) ×6 IMPLANT
GLOVE BIOGEL PI IND STRL 7.5 (GLOVE) ×1 IMPLANT
GLOVE BIOGEL PI IND STRL 8.5 (GLOVE) ×1 IMPLANT
GLOVE BIOGEL PI INDICATOR 7.5 (GLOVE) ×2
GLOVE BIOGEL PI INDICATOR 8.5 (GLOVE) ×2
GLOVE ECLIPSE 8.0 STRL XLNG CF (GLOVE) ×6 IMPLANT
GLOVE ORTHO TXT STRL SZ7.5 (GLOVE) ×3 IMPLANT
GOWN STRL REUS W/TWL 2XL LVL3 (GOWN DISPOSABLE) ×3 IMPLANT
GOWN STRL REUS W/TWL LRG LVL3 (GOWN DISPOSABLE) ×3 IMPLANT
HEAD CERAMIC DELTA 36 PLUS 1.5 (Hips) ×3 IMPLANT
HOLDER FOLEY CATH W/STRAP (MISCELLANEOUS) ×3 IMPLANT
LINER NEUTRAL 54X36MM PLUS 4 (Hips) ×3 IMPLANT
PACK ANTERIOR HIP CUSTOM (KITS) ×3 IMPLANT
SCREW 6.5MMX30MM (Screw) ×3 IMPLANT
STEM FEMORAL SZ6 HIGH ACTIS (Stem) ×3 IMPLANT
SUT MNCRL AB 4-0 PS2 18 (SUTURE) ×3 IMPLANT
SUT STRATAFIX 0 PDS 27 VIOLET (SUTURE) ×3
SUT VIC AB 1 CT1 36 (SUTURE) ×9 IMPLANT
SUT VIC AB 2-0 CT1 27 (SUTURE) ×4
SUT VIC AB 2-0 CT1 TAPERPNT 27 (SUTURE) ×2 IMPLANT
SUT VIC AB 2-0 CT2 27 (SUTURE) ×3 IMPLANT
SUTURE STRATFX 0 PDS 27 VIOLET (SUTURE) ×1 IMPLANT
TRAY FOLEY MTR SLVR 16FR STAT (SET/KITS/TRAYS/PACK) ×3 IMPLANT
WATER STERILE IRR 1000ML POUR (IV SOLUTION) ×3 IMPLANT
YANKAUER SUCT BULB TIP 10FT TU (MISCELLANEOUS) IMPLANT

## 2018-05-23 NOTE — Anesthesia Procedure Notes (Signed)
Spinal  Patient location during procedure: OR Start time: 05/23/2018 2:30 PM End time: 05/23/2018 2:35 PM Staffing Anesthesiologist: Murvin Natal, MD Performed: anesthesiologist  Preanesthetic Checklist Completed: patient identified, surgical consent, pre-op evaluation, timeout performed, IV checked, risks and benefits discussed and monitors and equipment checked Spinal Block Patient position: sitting Prep: DuraPrep Patient monitoring: cardiac monitor, continuous pulse ox and blood pressure Approach: midline Location: L4-5 Injection technique: single-shot Needle Needle type: Pencan  Needle gauge: 24 G Needle length: 9 cm Assessment Sensory level: T10 Additional Notes Functioning IV was confirmed and monitors were applied. Sterile prep and drape, including hand hygiene and sterile gloves were used. The patient was positioned and the spine was prepped. The skin was anesthetized with lidocaine.  Free flow of clear CSF was obtained prior to injecting local anesthetic into the CSF.  The spinal needle aspirated freely following injection.  The needle was carefully withdrawn.  The patient tolerated the procedure well.

## 2018-05-23 NOTE — Anesthesia Postprocedure Evaluation (Signed)
Anesthesia Post Note  Patient: Angel Lewis  Procedure(s) Performed: RIGHT TOTAL HIP ARTHROPLASTY ANTERIOR APPROACH (Right Hip)     Patient location during evaluation: PACU Anesthesia Type: Spinal Level of consciousness: oriented and awake and alert Pain management: pain level controlled Vital Signs Assessment: post-procedure vital signs reviewed and stable Respiratory status: spontaneous breathing, respiratory function stable and patient connected to nasal cannula oxygen Cardiovascular status: blood pressure returned to baseline and stable Postop Assessment: no headache, no backache, no apparent nausea or vomiting and spinal receding Anesthetic complications: no    Last Vitals:  Vitals:   05/23/18 1728 05/23/18 1821  BP: 128/75 125/81  Pulse: 64 63  Resp: 17 16  Temp: 36.7 C 36.5 C  SpO2: 93% 94%    Last Pain:  Vitals:   05/23/18 1821  TempSrc: Oral  PainSc:                  Ryan P Ellender

## 2018-05-23 NOTE — Interval H&P Note (Signed)
History and Physical Interval Note:  05/23/2018 12:59 PM  Angel Lewis  has presented today for surgery, with the diagnosis of Right hip osteoarthritis  The various methods of treatment have been discussed with the patient and family. After consideration of risks, benefits and other options for treatment, the patient has consented to  Procedure(s) with comments: RIGHT TOTAL HIP ARTHROPLASTY ANTERIOR APPROACH (Right) - 70 mins as a surgical intervention .  The patient's history has been reviewed, patient examined, no change in status, stable for surgery.  I have reviewed the patient's chart and labs.  Questions were answered to the patient's satisfaction.     Mauri Pole

## 2018-05-23 NOTE — Anesthesia Preprocedure Evaluation (Addendum)
Anesthesia Evaluation  Patient identified by MRN, date of birth, ID band Patient awake    Reviewed: Allergy & Precautions, NPO status , Patient's Chart, lab work & pertinent test results  Airway Mallampati: III  TM Distance: >3 FB Neck ROM: Full    Dental  (+) Loose, Chipped,    Pulmonary Current Smoker,    Pulmonary exam normal breath sounds clear to auscultation       Cardiovascular hypertension, Pt. on medications Normal cardiovascular exam Rhythm:Regular Rate:Normal  ECG: SB, rate 56   Neuro/Psych PSYCHIATRIC DISORDERS negative neurological ROS     GI/Hepatic negative GI ROS, (+)     substance abuse  ,   Endo/Other  negative endocrine ROS  Renal/GU negative Renal ROS     Musculoskeletal  (+) Arthritis , narcotic dependentSpondylolysis   Abdominal (+) + obese,   Peds  Hematology negative hematology ROS (+)   Anesthesia Other Findings Right hip osteoarthritis  Reproductive/Obstetrics                           Anesthesia Physical Anesthesia Plan  ASA: II  Anesthesia Plan: Spinal   Post-op Pain Management:    Induction:   PONV Risk Score and Plan: 0 and Propofol infusion and Treatment may vary due to age or medical condition  Airway Management Planned: Natural Airway  Additional Equipment:   Intra-op Plan:   Post-operative Plan:   Informed Consent: I have reviewed the patients History and Physical, chart, labs and discussed the procedure including the risks, benefits and alternatives for the proposed anesthesia with the patient or authorized representative who has indicated his/her understanding and acceptance.   Dental advisory given  Plan Discussed with: CRNA  Anesthesia Plan Comments:         Anesthesia Quick Evaluation

## 2018-05-23 NOTE — Discharge Instructions (Signed)

## 2018-05-23 NOTE — Transfer of Care (Signed)
Immediate Anesthesia Transfer of Care Note  Patient: Angel Lewis  Procedure(s) Performed: RIGHT TOTAL HIP ARTHROPLASTY ANTERIOR APPROACH (Right Hip)  Patient Location: PACU  Anesthesia Type:MAC and Spinal  Level of Consciousness: awake, alert , oriented and patient cooperative  Airway & Oxygen Therapy: Patient Spontanous Breathing and Patient connected to face mask oxygen  Post-op Assessment: Report given to RN, Post -op Vital signs reviewed and stable and Patient moving all extremities  Post vital signs: Reviewed and stable  Last Vitals:  Vitals Value Taken Time  BP 135/87 05/23/2018  4:30 PM  Temp    Pulse 50 05/23/2018  4:32 PM  Resp 12 05/23/2018  4:32 PM  SpO2 100 % 05/23/2018  4:32 PM  Vitals shown include unvalidated device data.  Last Pain:  Vitals:   05/23/18 1209  TempSrc:   PainSc: 5       Patients Stated Pain Goal: 4 ([id number])  Complications: No apparent anesthesia complications

## 2018-05-23 NOTE — Op Note (Signed)
NAME:  Angel Lewis                ACCOUNT NO.: [id number]      MEDICAL RECORD NO.: [id number]      FACILITY:  [facility]      PHYSICIAN:  Mauri Pole  DATE OF BIRTH:  1951-03-13     DATE OF PROCEDURE:  05/23/2018                                 OPERATIVE REPORT         PREOPERATIVE DIAGNOSIS: Right  hip osteoarthritis.      POSTOPERATIVE DIAGNOSIS:  Right hip osteoarthritis.      PROCEDURE:  Right total hip replacement through an anterior approach   utilizing DePuy THR system, component size 8mm pinnacle cup, a size 36+4 neutral   Altrex liner, a size 6 Hi Actis stem with a 36+1.5 delta ceramic   ball.      SURGEON:  Pietro Cassis. Alvan Dame, M.D.      ASSISTANT:  Nehemiah Massed, PA-C     ANESTHESIA:  Spinal.      SPECIMENS:  None.      COMPLICATIONS:  None.      BLOOD LOSS:  350 cc     DRAINS:  None.      INDICATION OF THE PROCEDURE:  Angel Lewis is a 67 y.o. male who had   presented to office for evaluation of right hip pain.  Radiographs revealed   progressive degenerative changes with bone-on-bone   articulation of the  hip joint, including subchondral cystic changes and osteophytes.  The patient had painful limited range of   motion significantly affecting their overall quality of life and function.  The patient was failing to    respond to conservative measures including medications and/or injections and activity modification and at this point was ready   to proceed with more definitive measures.  Consent was obtained for   benefit of pain relief.  Specific risks of infection, DVT, component   failure, dislocation, neurovascular injury, and need for revision surgery were reviewed in the office as well discussion of   the anterior versus posterior approach were reviewed.     PROCEDURE IN DETAIL:  The patient was brought to operative theater.   Once adequate anesthesia, preoperative antibiotics, 2 gm of Ancef, 1 gm of Tranexamic Acid, and 10 mg of  Decadron were administered, the patient was positioned supine on the [employer] table.  Once the patient was safely positioned with adequate padding of boney prominences we predraped out the hip, and used fluoroscopy to confirm orientation of the pelvis.      The right hip was then prepped and draped from proximal iliac crest to   mid thigh with a shower curtain technique.      Time-out was performed identifying the patient, planned procedure, and the appropriate extremity.     An incision was then made 2 cm lateral to the   anterior superior iliac spine extending over the orientation of the   tensor fascia lata muscle and sharp dissection was carried down to the   fascia of the muscle.      The fascia was then incised.  The muscle belly was identified and swept   laterally and retractor placed along the superior neck.  Following   cauterization of the circumflex vessels and removing some pericapsular  fat, a second cobra retractor was placed on the inferior neck.  A T-capsulotomy was made along the line of the   superior neck to the trochanteric fossa, then extended proximally and   distally.  Tag sutures were placed and the retractors were then placed   intracapsular.  We then identified the trochanteric fossa and   orientation of my neck cut and then made a neck osteotomy with the femur on traction.  The femoral   head was removed without difficulty or complication.  Traction was let   off and retractors were placed posterior and anterior around the   acetabulum.      The labrum and foveal tissue were debrided.  I began reaming with a 46 mm   reamer and reamed up to 53 mm reamer with good bony bed preparation and a 54 mm  cup was chosen.  The final 54 mm Pinnacle cup was then impacted under fluoroscopy to confirm the depth of penetration and orientation with respect to   Abduction and forward flexion.  A screw was placed into the ilium followed by the hole eliminator.  The final   36+4  neutral Altrex liner was impacted with good visualized rim fit.  The cup was positioned anatomically within the acetabular portion of the pelvis.      At this point, the femur was rolled to 100 degrees.  Further capsule was   released off the inferior aspect of the femoral neck.  I then   released the superior capsule proximally.  With the leg in a neutral position the hook was placed laterally   along the femur under the vastus lateralis origin and elevated manually and then held in position using the hook attachment on the bed.  The leg was then extended and adducted with the leg rolled to 100   degrees of external rotation.  Retractors were placed along the medial calcar and posteriorly over the greater trochanter.  Once the proximal femur was fully   exposed, I used a box osteotome to set orientation.  I then began   broaching with the starting chili pepper broach and passed this by hand and then broached up to 6.  With the 6 broach in place I chose a high offset neck and did several trial reductions.  The offset was appropriate, leg lengths   appeared to be equal best matched with the +1.5 head ball trial confirmed radiographically.   Given these findings, I went ahead and dislocated the hip, repositioned all   retractors and positioned the right hip in the extended and abducted position.  The final 6 Hi Actis stem was   chosen and it was impacted down to the level of neck cut.  Based on this   and the trial reductions, a final 36+1.5 delta ceramic ball was chosen and   impacted onto a clean and dry trunnion, and the hip was reduced.  The   hip had been irrigated throughout the case again at this point.  I did   reapproximate the superior capsular leaflet to the anterior leaflet   using #1 Vicryl.  The fascia of the   tensor fascia lata muscle was then reapproximated using #1 Vicryl and #0 Stratafix sutures.  The   remaining wound was closed with 2-0 Vicryl and running 4-0 Monocryl.   The  hip was cleaned, dried, and dressed sterilely using Dermabond and   Aquacel dressing.  The patient was then brought   to recovery room in  stable condition tolerating the procedure well.    Nehemiah Massed, PA-C was present for the entirety of the case involved from   preoperative positioning, perioperative retractor management, general   facilitation of the case, as well as primary wound closure as assistant.            Pietro Cassis Alvan Dame, M.D.        05/23/2018 3:53 PM

## 2018-05-24 ENCOUNTER — Encounter (HOSPITAL_COMMUNITY): Payer: Self-pay | Admitting: Orthopedic Surgery

## 2018-05-24 DIAGNOSIS — E669 Obesity, unspecified: Secondary | ICD-10-CM | POA: Diagnosis present

## 2018-05-24 LAB — CBC
HEMATOCRIT: 39 % (ref 39.0–52.0)
HEMOGLOBIN: 12.5 g/dL — AB (ref 13.0–17.0)
MCH: 29.5 pg (ref 26.0–34.0)
MCHC: 32.1 g/dL (ref 30.0–36.0)
MCV: 92 fL (ref 80.0–100.0)
Platelets: 341 10*3/uL (ref 150–400)
RBC: 4.24 MIL/uL (ref 4.22–5.81)
RDW: 13.6 % (ref 11.5–15.5)
WBC: 10.6 10*3/uL — ABNORMAL HIGH (ref 4.0–10.5)
nRBC: 0 % (ref 0.0–0.2)

## 2018-05-24 LAB — BASIC METABOLIC PANEL
Anion gap: 8 (ref 5–15)
BUN: 12 mg/dL (ref 8–23)
CHLORIDE: 104 mmol/L (ref 98–111)
CO2: 26 mmol/L (ref 22–32)
CREATININE: 0.67 mg/dL (ref 0.61–1.24)
Calcium: 8.6 mg/dL — ABNORMAL LOW (ref 8.9–10.3)
GFR calc Af Amer: >60 mL/min (ref >60)
GFR calc non Af Amer: >60 mL/min (ref >60)
Glucose, Bld: 158 mg/dL — ABNORMAL HIGH (ref 70–99)
Potassium: 4.3 mmol/L (ref 3.5–5.1)
Sodium: 138 mmol/L (ref 135–145)

## 2018-05-24 MED ORDER — FAMOTIDINE 20 MG PO TABS: 10.0000 mg | ORAL_TABLET | Freq: Every day | ORAL | Status: DC | PRN

## 2018-05-24 NOTE — Progress Notes (Signed)
     Subjective: 1 Day Post-Op Procedure(s) (LRB): RIGHT TOTAL HIP ARTHROPLASTY ANTERIOR APPROACH (Right)   Patient reports pain as mild, pain controlled. No events throughout the night. States that he didn't get much sleep last night. Looking forward to working with PT.  Ready to be discharged home.    Objective:   VITALS:   Vitals:   05/24/18 0116 05/24/18 0457  BP: (!) 149/87 (!) 147/78  Pulse: 63 62  Resp: 14 16  Temp:  98.2 F (36.8 C)  SpO2: 96% 94%    Dorsiflexion/Plantar flexion intact Incision: dressing C/D/I No cellulitis present Compartment soft  LABS Recent Labs    05/24/18 0409  HGB 12.5*  HCT 39.0  WBC 10.6*  PLT 341    Recent Labs    05/24/18 0409  NA 138  K 4.3  BUN 12  CREATININE 0.67  GLUCOSE 158*     Assessment/Plan: 1 Day Post-Op Procedure(s) (LRB): RIGHT TOTAL HIP ARTHROPLASTY ANTERIOR APPROACH (Right) Foley cath d/c'ed Advance diet Up with therapy D/C IV fluids Discharge home Follow up in 2 weeks at [facility] Follow up with OLIN,Sunbright Antolin D in 2 weeks.  Contact information:  [facility]) [address] [place] [phone]    Obese (BMI 30-39.9) Estimated body mass index is 31.61 kg/m as calculated from the following:   Height as of this encounter: 5\' 7"  (1.702 m).   Weight as of this encounter: 91.5 kg. Patient also counseled that weight may inhibit the healing process Patient counseled that losing weight will help with future health issues      West Pugh. Kyheem [place]gate   PAC  05/24/2018, 9:42 AM

## 2018-05-24 NOTE — Progress Notes (Signed)
Physical Therapy Treatment Patient Details Name: Angel Lewis MRN: [id number] DOB: 09/01/1950 Today's Date: 05/24/2018    History of Present Illness Pt s/p R THR and with hx of Prostate CA    PT Comments    Pt progressing steadily with mobility and eager for dc home today.  Spouse present to review car transfers, stairs and home therex program with written instruction provided.   Follow Up Recommendations  Follow surgeon's recommendation for DC plan and follow-up therapies     Equipment Recommendations  Rolling walker with 5" wheels    Recommendations for Other Services       Precautions / Restrictions Precautions Precautions: Fall Restrictions Weight Bearing Restrictions: No Other Position/Activity Restrictions: WBAT    Mobility  Bed Mobility Overal bed mobility: Needs Assistance Bed Mobility: Supine to Sit;Sit to Supine     Supine to sit: Supervision Sit to supine: Min guard;Supervision   General bed mobility comments: cues for sequence and use of L LE to self assist  Transfers Overall transfer level: Needs assistance Equipment used: Rolling walker (2 wheeled) Transfers: Sit to/from Stand Sit to Stand: Min guard;Supervision         General transfer comment: cues for LE management and use of UEs to self assist  Ambulation/Gait Ambulation/Gait assistance: Min guard;Supervision Gait Distance (Feet): 140 Feet Assistive device: Rolling walker (2 wheeled) Gait Pattern/deviations: Step-to pattern;Step-through pattern;Decreased step length - right;Decreased step length - left;Shuffle Gait velocity: decr   General Gait Details: cues for sequence, posture and position from RW   Stairs Stairs: Yes Stairs assistance: Min assist Stair Management: No rails;Step to pattern;Backwards;With walker Number of Stairs: 4 General stair comments: 2 steps twice with RW bkwd; cues for sequence and foot/RW placement; spouse assisting and written instruction  provided   Wheelchair Mobility    Modified Rankin (Stroke Patients Only)       Balance Overall balance assessment: Mild deficits observed, not formally tested                                          Cognition Arousal/Alertness: Awake/alert Behavior During Therapy: WFL for tasks assessed/performed Overall Cognitive Status: Within Functional Limits for tasks assessed                                        Exercises Total Joint Exercises Ankle Circles/Pumps: AROM;Both;20 reps;Supine Quad Sets: AROM;Both;10 reps;Supine Heel Slides: AAROM;Right;20 reps;Supine Hip ABduction/ADduction: AAROM;Right;15 reps;Supine Long Arc Quad: AROM;Right;10 reps;Seated    General Comments        Pertinent Vitals/Pain Pain Assessment: 0-10 Pain Score: 5  Pain Location: R hip Pain Descriptors / Indicators: Aching;Burning Pain Intervention(s): Limited activity within patient's tolerance;Monitored during session;Premedicated before session;Ice applied    Home Living                      Prior Function            PT Goals (current goals can now be found in the care plan section) Acute Rehab PT Goals Patient Stated Goal: Regain IND PT Goal Formulation: With patient Time For Goal Achievement: 05/31/18 Potential to Achieve Goals: Good Progress towards PT goals: Progressing toward goals    Frequency    7X/week      PT Plan Current plan remains appropriate  Co-evaluation              AM-PAC PT "6 Clicks" Daily Activity  Outcome Measure  Difficulty turning over in bed (including adjusting bedclothes, sheets and blankets)?: A Lot Difficulty moving from lying on back to sitting on the side of the bed? : A Lot Difficulty sitting down on and standing up from a chair with arms (e.g., wheelchair, bedside commode, etc,.)?: A Lot Help needed moving to and from a bed to chair (including a wheelchair)?: A Little Help needed walking in  hospital room?: A Little Help needed climbing 3-5 steps with a railing? : A Little 6 Click Score: 15    End of Session Equipment Utilized During Treatment: Gait belt Activity Tolerance: Patient tolerated treatment well Patient left: in chair;with call bell/phone within reach;with chair alarm set Nurse Communication: Mobility status PT Visit Diagnosis: Difficulty in walking, not elsewhere classified (R26.2)     Time: 1324-4010 PT Time Calculation (min) (ACUTE ONLY): 43 min  Charges:  $Gait Training: 8-22 mins $Therapeutic Exercise: 8-22 mins $Therapeutic Activity: 8-22 mins                     [place] Pager [phone] Office [phone]    Annsley Akkerman 05/24/2018, 4:57 PM

## 2018-05-24 NOTE — Progress Notes (Signed)
Physical Therapy Treatment Patient Details Name: Angel Lewis MRN: [id number] DOB: [birth year] Today's Date: 05/24/2018    History of Present Illness Pt s/p R THR and with hx of Prostate CA    PT Comments    Pt progressing well with mobility and motivated for dc home later this date.   Follow Up Recommendations  Follow surgeon's recommendation for DC plan and follow-up therapies     Equipment Recommendations  Rolling walker with 5" wheels    Recommendations for Other Services       Precautions / Restrictions Precautions Precautions: Fall Restrictions Weight Bearing Restrictions: No Other Position/Activity Restrictions: WBAT    Mobility  Bed Mobility Overal bed mobility: Needs Assistance Bed Mobility: Supine to Sit     Supine to sit: Min guard     General bed mobility comments: cues for sequence and use of L LE to self assist  Transfers Overall transfer level: Needs assistance Equipment used: Rolling walker (2 wheeled) Transfers: Sit to/from Stand Sit to Stand: Min guard         General transfer comment: cues for LE management and use of UEs to self assist  Ambulation/Gait Ambulation/Gait assistance: Min assist;Min guard Gait Distance (Feet): 140 Feet Assistive device: Rolling walker (2 wheeled) Gait Pattern/deviations: Step-to pattern;Step-through pattern;Decreased step length - right;Decreased step length - left;Shuffle Gait velocity: decr   General Gait Details: cues for sequence, posture and position from [employer]             Wheelchair Mobility    Modified Rankin (Stroke Patients Only)       Balance Overall balance assessment: Mild deficits observed, not formally tested                                          Cognition Arousal/Alertness: Awake/alert Behavior During Therapy: WFL for tasks assessed/performed Overall Cognitive Status: Within Functional Limits for tasks assessed                                         Exercises Total Joint Exercises Ankle Circles/Pumps: AROM;Both;20 reps;Supine Quad Sets: AROM;Both;10 reps;Supine Heel Slides: AAROM;Right;20 reps;Supine Hip ABduction/ADduction: AAROM;Right;15 reps;Supine    General Comments        Pertinent Vitals/Pain Pain Assessment: 0-10 Pain Score: 7  Pain Location: R hip Pain Descriptors / Indicators: Aching;Burning Pain Intervention(s): Limited activity within patient's tolerance;Monitored during session;Premedicated before session;Ice applied    Home Living Family/patient expects to be discharged to:: Private residence Living Arrangements: Spouse/significant other Available Help at Discharge: Family Type of Home: House Home Access: Stairs to enter Entrance Stairs-Rails: None Home Layout: One level Home Equipment: Environmental consultant - 2 wheels;Bedside commode      Prior Function Level of Independence: Independent          PT Goals (current goals can now be found in the care plan section) Acute Rehab PT Goals Patient Stated Goal: Regain IND PT Goal Formulation: With patient Time For Goal Achievement: 05/31/18 Potential to Achieve Goals: Good Progress towards PT goals: Progressing toward goals    Frequency    7X/week      PT Plan Current plan remains appropriate    Co-evaluation              AM-PAC PT "6 Clicks" Daily Activity  Outcome Measure  Difficulty turning over in bed (including adjusting bedclothes, sheets and blankets)?: A Lot Difficulty moving from lying on back to sitting on the side of the bed? : A Lot Difficulty sitting down on and standing up from a chair with arms (e.g., wheelchair, bedside commode, etc,.)?: A Lot Help needed moving to and from a bed to chair (including a wheelchair)?: A Little Help needed walking in hospital room?: A Little Help needed climbing 3-5 steps with a railing? : A Little 6 Click Score: 15    End of Session Equipment Utilized During Treatment: Gait  belt Activity Tolerance: Patient tolerated treatment well Patient left: in chair;with call bell/phone within reach;with chair alarm set Nurse Communication: Mobility status PT Visit Diagnosis: Difficulty in walking, not elsewhere classified (R26.2)     Time: 4536-4680 PT Time Calculation (min) (ACUTE ONLY): 16 min  Charges:  $Gait Training: 8-22 mins                     [place] Pager [phone] Office [phone]    Jacora Hopkins 05/24/2018, 12:17 PM

## 2018-05-24 NOTE — Evaluation (Signed)
Physical Therapy Evaluation Patient Details Name: Angel Lewis MRN: [id number] DOB: [birth year] Today's Date: 05/24/2018   History of Present Illness  Pt s/p R THR and with hx of Prostate CA  Clinical Impression  Pt s/p R THR and presents with decreased R LE strength/ROM and post op pain limiting functional mobility.  Pt should progress to dc home with family assist.    Follow Up Recommendations Follow surgeon's recommendation for DC plan and follow-up therapies    Equipment Recommendations  Rolling walker with 5" wheels    Recommendations for Other Services       Precautions / Restrictions Precautions Precautions: Fall Restrictions Weight Bearing Restrictions: No Other Position/Activity Restrictions: WBAT      Mobility  Bed Mobility                  Transfers                 General transfer comment: deferred to after bfast on arrival of meal  Ambulation/Gait                Stairs            Wheelchair Mobility    Modified Rankin (Stroke Patients Only)       Balance                                             Pertinent Vitals/Pain Pain Assessment: 0-10 Pain Score: 7  Pain Location: R hip Pain Descriptors / Indicators: Aching;Burning Pain Intervention(s): Limited activity within patient's tolerance;Monitored during session;Premedicated before session;Ice applied;Patient requesting pain meds-RN notified    Home Living Family/patient expects to be discharged to:: Private residence Living Arrangements: Spouse/significant other Available Help at Discharge: Family Type of Home: House Home Access: Stairs to enter Entrance Stairs-Rails: None Entrance Stairs-Number of Steps: 2 Home Layout: One level Home Equipment: Environmental consultant - 2 wheels;Bedside commode      Prior Function Level of Independence: Independent               Hand Dominance        Extremity/Trunk Assessment   Upper Extremity  Assessment Upper Extremity Assessment: Overall WFL for tasks assessed    Lower Extremity Assessment Lower Extremity Assessment: RLE deficits/detail RLE Deficits / Details: 2+/5 strength at hip with AAROM at hip to 90 flex and 15 abd    Cervical / Trunk Assessment Cervical / Trunk Assessment: Normal  Communication   Communication: No difficulties  Cognition Arousal/Alertness: Awake/alert Behavior During Therapy: WFL for tasks assessed/performed Overall Cognitive Status: Within Functional Limits for tasks assessed                                        General Comments      Exercises Total Joint Exercises Ankle Circles/Pumps: AROM;Both;20 reps;Supine Quad Sets: AROM;Both;10 reps;Supine Heel Slides: AAROM;Right;20 reps;Supine Hip ABduction/ADduction: AAROM;Right;15 reps;Supine   Assessment/Plan    PT Assessment Patient needs continued PT services  PT Problem List Decreased strength;Decreased range of motion;Decreased activity tolerance;Decreased mobility;Decreased knowledge of use of DME;Pain       PT Treatment Interventions DME instruction;Gait training;Stair training;Functional mobility training;Therapeutic activities;Therapeutic exercise;Patient/family education    PT Goals (Current goals can be found in the Care Plan section)  Acute Rehab PT Goals Patient  Stated Goal: Regain IND PT Goal Formulation: With patient Time For Goal Achievement: 05/31/18 Potential to Achieve Goals: Good    Frequency 7X/week   Barriers to discharge        Co-evaluation               AM-PAC PT "6 Clicks" Daily Activity  Outcome Measure Difficulty turning over in bed (including adjusting bedclothes, sheets and blankets)?: Unable Difficulty moving from lying on back to sitting on the side of the bed? : Unable Difficulty sitting down on and standing up from a chair with arms (e.g., wheelchair, bedside commode, etc,.)?: Unable Help needed moving to and from a bed to  chair (including a wheelchair)?: A Little Help needed walking in hospital room?: A Little Help needed climbing 3-5 steps with a railing? : A Little 6 Click Score: 12    End of Session   Activity Tolerance: Patient tolerated treatment well Patient left: in bed;with call bell/phone within reach Nurse Communication: Mobility status PT Visit Diagnosis: Difficulty in walking, not elsewhere classified (R26.2)    Time: 9485-4627 PT Time Calculation (min) (ACUTE ONLY): 15 min   Charges:   PT Evaluation $PT Eval Low Complexity: [place] Pager [phone] Office [phone]   Krina Mraz 05/24/2018, 12:08 PM

## 2018-05-30 NOTE — Discharge Summary (Signed)
Physician Discharge Summary  Patient ID: Angel Lewis MRN: [id number] DOB/AGE: 08-27-1950 67 y.o.  Admit date: 05/23/2018 Discharge date: 05/24/2018   Procedures:  Procedure(s) (LRB): RIGHT TOTAL HIP ARTHROPLASTY ANTERIOR APPROACH (Right)  Attending Physician:  Dr. Paralee Cancel   Admission Diagnoses:   Right hip primary OA / pain  Discharge Diagnoses:  Principal Problem:   S/P right THA, AA Active Problems:   Obese  Past Medical History:  Diagnosis Date  . Arthritis   . Cancer [facility])    prostate  . Hypertension   . Spondylolysis    all over     HPI:    Angel Lewis, 67 y.o. male, has a history of pain and functional disability in the right hip(s) due to arthritis and patient has failed non-surgical conservative treatments for greater than 12 weeks to include NSAID's and/or analgesics, corticosteriod injections, use of assistive devices and activity modification.  Onset of symptoms was gradual starting 6+ months ago with rapidlly worsening course since that time.The patient noted no past surgery on the right hip(s).  Patient currently rates pain in the right hip at 10 out of 10 with activity. Patient has worsening of pain with activity and weight bearing, trendelenberg gait, pain that interfers with activities of daily living and pain with passive range of motion. Patient has evidence of periarticular osteophytes and joint space narrowing by imaging studies. This condition presents safety issues increasing the risk of falls.  There is no current active infection.  Risks, benefits and expectations were discussed with the patient.  Risks including but not limited to the risk of anesthesia, blood clots, nerve damage, blood vessel damage, failure of the prosthesis, infection and up to and including death.  Patient understand the risks, benefits and expectations and wishes to proceed with surgery.   PCP: Angel Munch, PA-C   Discharged Condition: good  Hospital Course:   Patient underwent the above stated procedure on 05/23/2018. Patient tolerated the procedure well and brought to the recovery room in good condition and subsequently to the floor.  POD #1 BP: 147/78 ; Pulse: 62 ; Temp: 98.2 F (36.8 C) ; Resp: 16 Patient reports pain as mild, pain controlled. No events throughout the night. States that he didn't get much sleep last night. Looking forward to working with PT.  Ready to be discharged home. Dorsiflexion/plantar flexion intact, incision: dressing C/D/I, no cellulitis present and compartment soft.   LABS  Basename    HGB     12.5  HCT     39.0    Discharge Exam: General appearance: alert, cooperative and no distress Extremities: Homans sign is negative, no sign of DVT, no edema, redness or tenderness in the calves or thighs and no ulcers, gangrene or trophic changes  Disposition:  Home with follow up in 2 weeks   Follow-up Information    Paralee Cancel, MD. Schedule an appointment as soon as possible for a visit in 2 weeks.   Specialty:  Orthopedic Surgery Contact information: [address] [place] [address] [phone]           Discharge Instructions    Call MD / Call 911   Complete by:  As directed    If you experience chest pain or shortness of breath, CALL 911 and be transported to the hospital emergency room.  If you develope a fever above 101 F, pus (white drainage) or increased drainage or redness at the wound, or calf pain, call your surgeon's  office.   Change dressing   Complete by:  As directed    Maintain surgical dressing until follow up in the clinic. If the edges start to pull up, may reinforce with tape. If the dressing is no longer working, may remove and cover with gauze and tape, but must keep the area dry and clean.  Call with any questions or concerns.   Constipation Prevention   Complete by:  As directed    Drink plenty of fluids.  Prune juice may be helpful.  You may use a stool softener,  such as Colace (over the counter) 100 mg twice a day.  Use MiraLax (over the counter) for constipation as needed.   Diet - low sodium heart healthy   Complete by:  As directed    Discharge instructions   Complete by:  As directed    Maintain surgical dressing until follow up in the clinic. If the edges start to pull up, may reinforce with tape. If the dressing is no longer working, may remove and cover with gauze and tape, but must keep the area dry and clean.  Follow up in 2 weeks at [facility]. Call with any questions or concerns.   Increase activity slowly as tolerated   Complete by:  As directed    Weight bearing as tolerated with assist device (walker, cane, etc) as directed, use it as long as suggested by your surgeon or therapist, typically at least 4-6 weeks.   TED hose   Complete by:  As directed    Use stockings (TED hose) for 2 weeks on both leg(s).  You may remove them at night for sleeping.      Allergies as of 05/24/2018   No Known Allergies     Medication List    STOP taking these medications   aspirin EC 325 MG tablet Replaced by:  aspirin 81 MG chewable tablet   DULoxetine 30 MG capsule Commonly known as:  CYMBALTA   ibuprofen 200 MG tablet Commonly known as:  ADVIL,MOTRIN     TAKE these medications   amLODipine-benazepril 10-40 MG capsule Commonly known as:  LOTREL Take 1 capsule by mouth at bedtime.   aspirin 81 MG chewable tablet Chew 1 tablet (81 mg total) by mouth 2 (two) times daily. Take for 4 weeks, then resume regular dose. Replaces:  aspirin EC 325 MG tablet   baclofen 10 MG tablet Commonly known as:  LIORESAL Take 1 tablet (10 mg total) by mouth 3 (three) times daily as needed for muscle spasms.   buprenorphine-naloxone 8-2 mg Subl SL tablet Commonly known as:  SUBOXONE Place 1 tablet under the tongue 3 (three) times daily.   Co Q-10 100 MG Caps Take 100 mg by mouth daily.   docusate sodium 100 MG capsule Commonly known as:   COLACE Take 1 capsule (100 mg total) by mouth 2 (two) times daily.   ferrous sulfate 325 (65 FE) MG tablet Take 1 tablet (325 mg total) by mouth 3 (three) times daily with meals.   Garlic 7209 MG Caps Take 1,200 mg by mouth daily.   HYDROcodone-acetaminophen 7.5-325 MG tablet Commonly known as:  NORCO Take 1-2 tablets by mouth every 4 (four) hours as needed for moderate pain.   HYDROcodone-acetaminophen 7.5-325 MG tablet Commonly known as:  NORCO Take 1-2 tablets by mouth every 4 (four) hours as needed for moderate pain.   Magnesium 400 MG Tabs Take 800 mg by mouth daily as needed (constipation).   mometasone 50  MCG/ACT nasal spray Commonly known as:  NASONEX Place 2 sprays into the nose daily as needed (allergies).   polyethylene glycol packet Commonly known as:  MIRALAX / GLYCOLAX Take 17 g by mouth 2 (two) times daily.   ranitidine 75 MG tablet Commonly known as:  ZANTAC Take 75 mg by mouth daily as needed for heartburn.   REFRESH REPAIR 0.5-0.9 % ophthalmic solution Generic drug:  carboxymethylcellul-glycerin Place 1 drop into both eyes at bedtime.   vitamin C 1000 MG tablet Take 1,000 mg by mouth daily.   Vitamin D 50 MCG (2000 UT) tablet Take 2,000 Units by mouth daily.   vitamin E 200 UNIT capsule Take 200 Units by mouth daily.            Discharge Care Instructions  (From admission, onward)         Start     Ordered   05/24/18 0000  Change dressing    Comments:  Maintain surgical dressing until follow up in the clinic. If the edges start to pull up, may reinforce with tape. If the dressing is no longer working, may remove and cover with gauze and tape, but must keep the area dry and clean.  Call with any questions or concerns.   05/24/18 0941           Signed: [place] Pugh. [place] Buntyn   PA-C  05/30/2018, 1:03 PM

## 2018-07-06 DIAGNOSIS — Z4732 Aftercare following explantation of hip joint prosthesis: Secondary | ICD-10-CM | POA: Diagnosis not present

## 2019-04-06 IMAGING — DX DG HIP (WITH OR WITHOUT PELVIS) 2-3V*R*
3 series · 3 of 3 positions shown · non-contrast
Comparison: Right hip series dated December 23, 2016

CLINICAL DATA: Severe right knee and hip pain for the past 2 weeks
with no known injury. History of prostate malignancy and arthritis.

EXAM:
DG HIP (WITH OR WITHOUT PELVIS) 2-3V RIGHT

[pelvis ap]
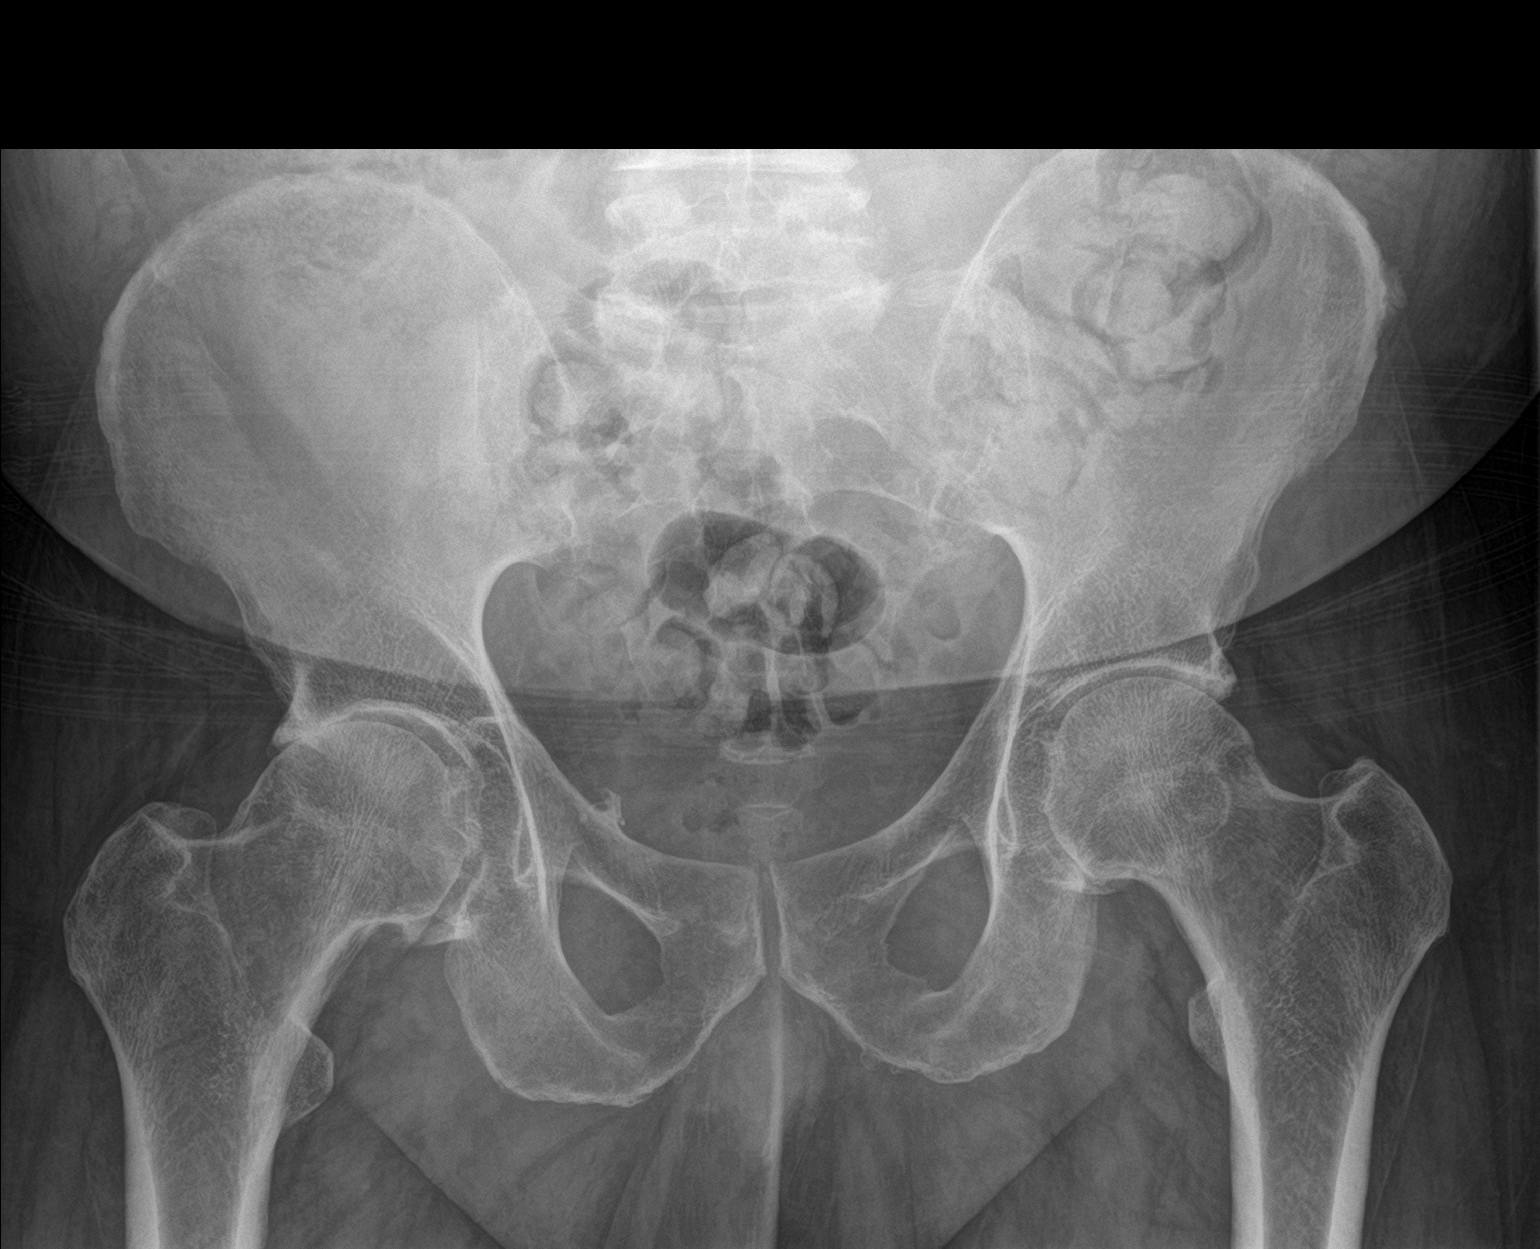

[hip ap]
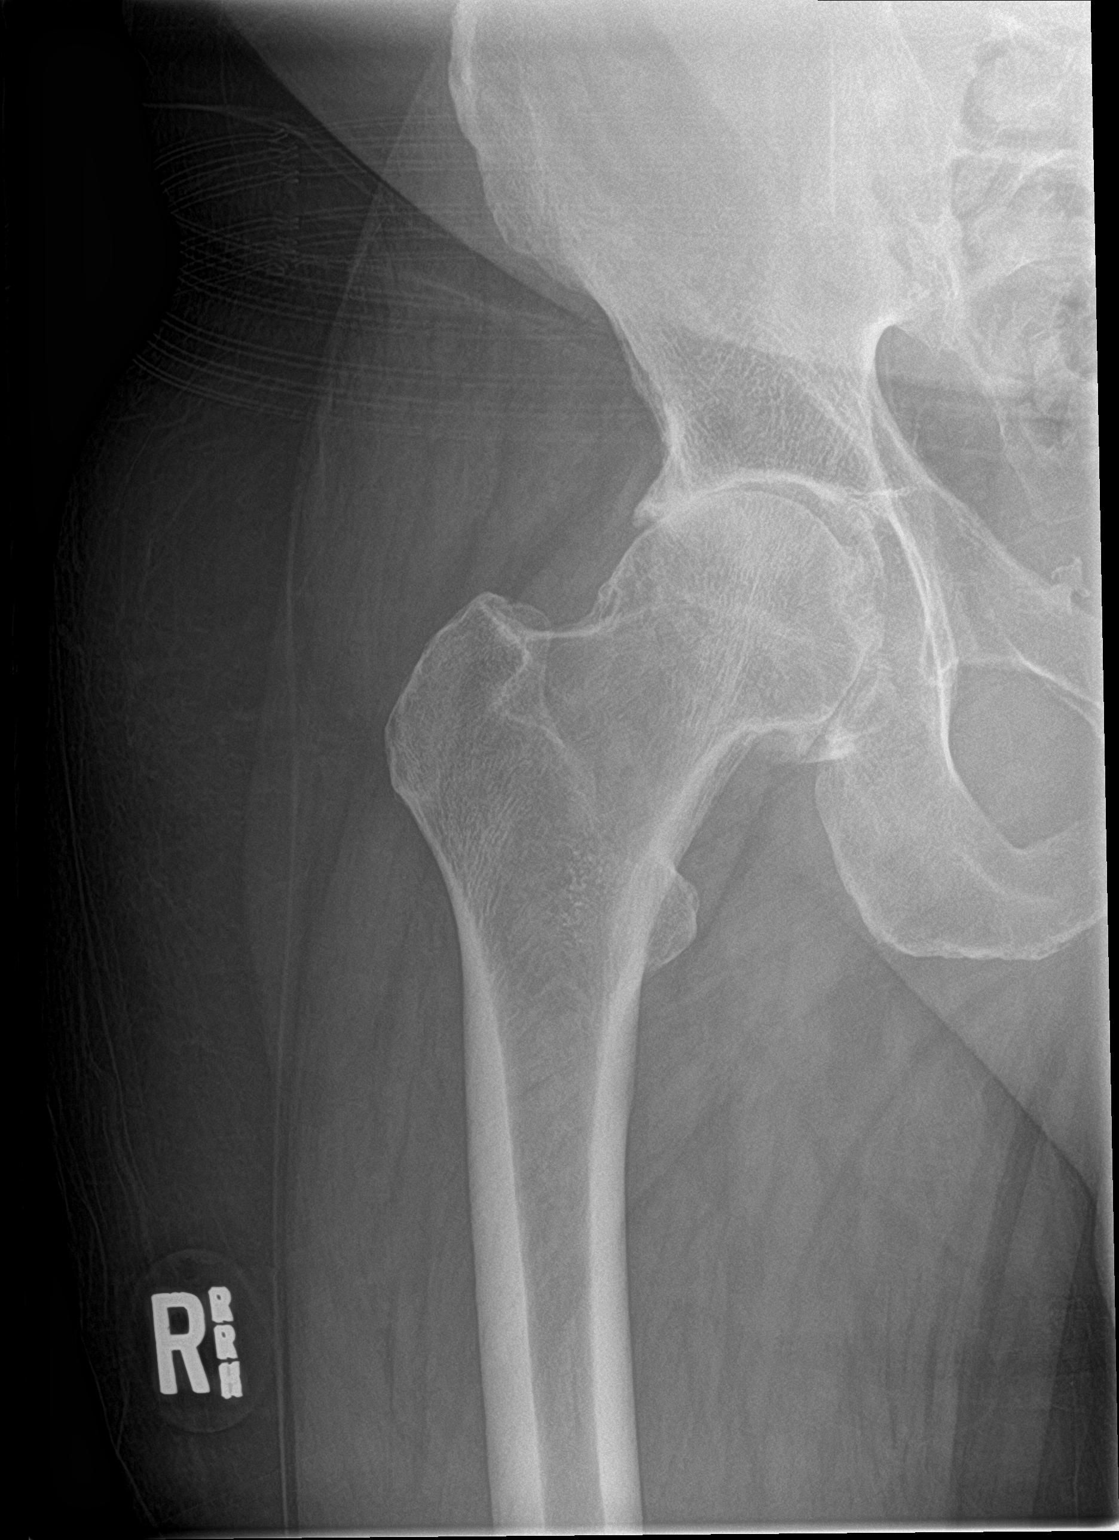

[hip lat]
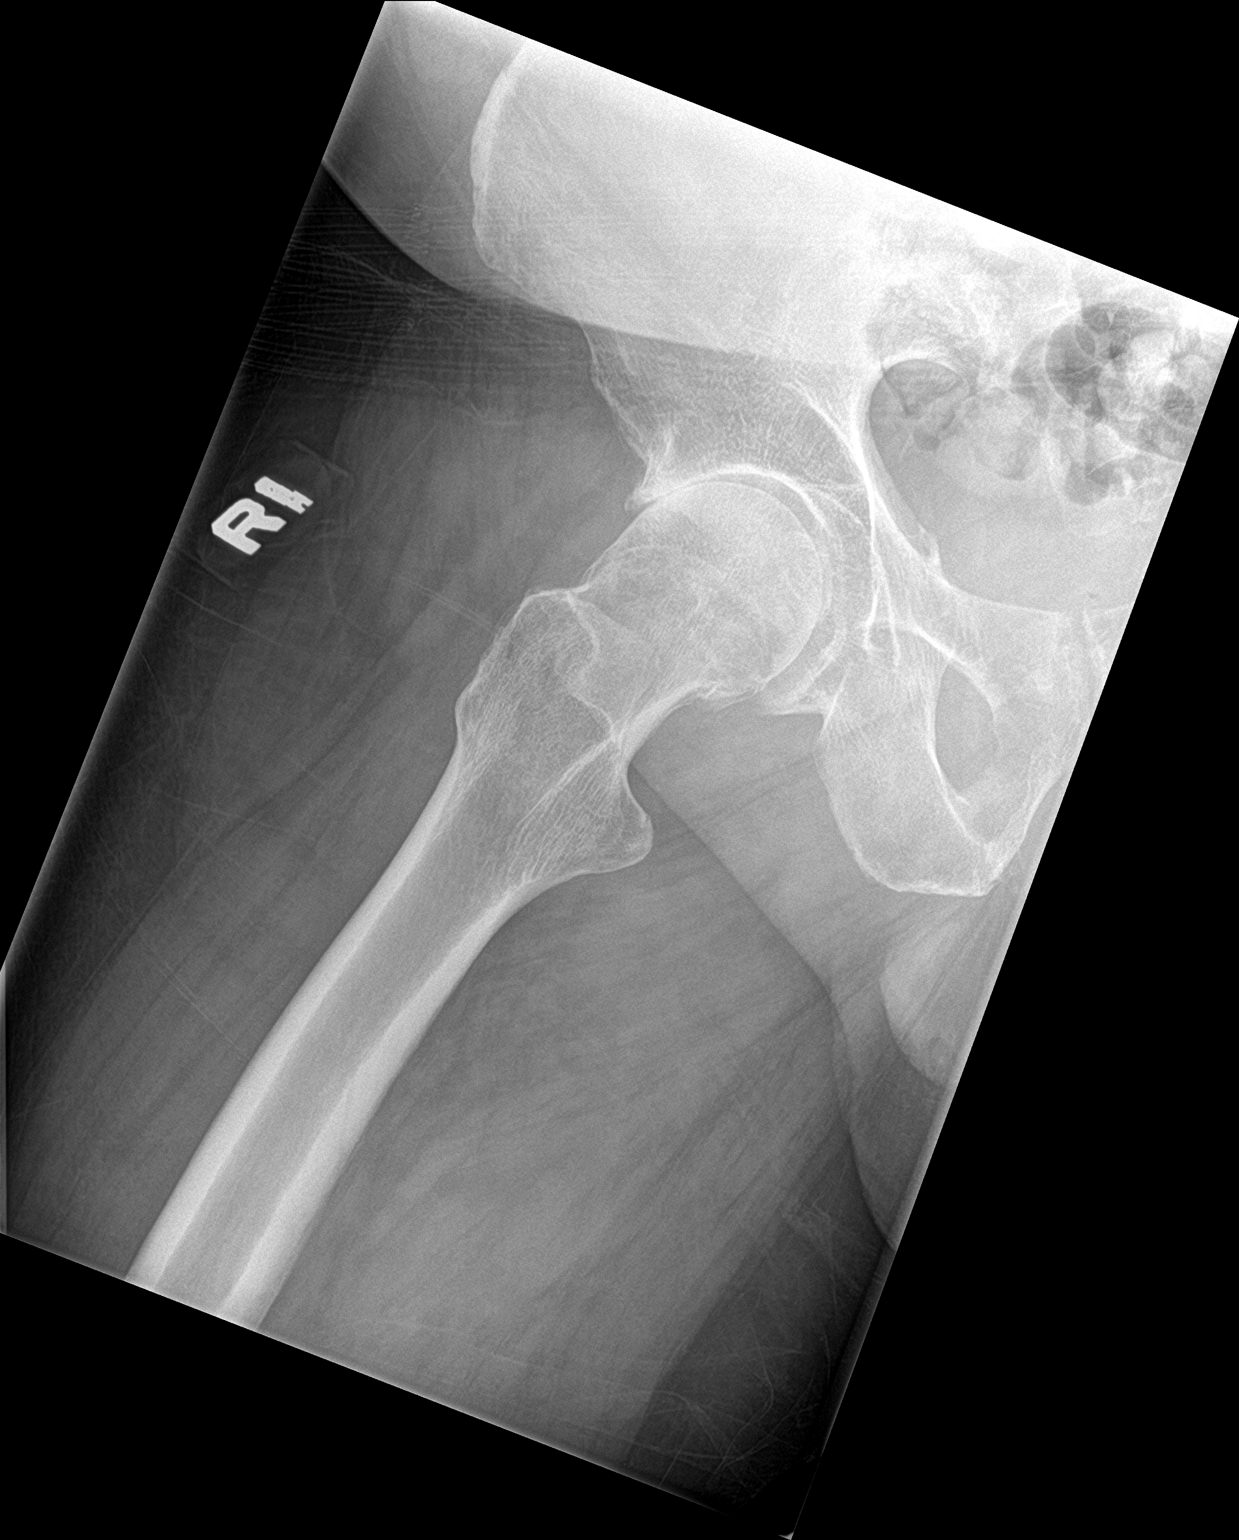

[3 of 3 positions shown; findings below may reference images not displayed]

FINDINGS: The bony pelvis is subjectively adequately mineralized. There is no
lytic nor blastic lesion. There is high-grade asymmetric joint space
loss of the right hip superiorly. The articular surfaces of the
right femoral head and acetabulum remains smoothly rounded. The
femoral neck, intertrochanteric, and subtrochanteric regions are
normal.
IMPRESSION: Severe asymmetric joint space loss superiorly of the right hip
consistent with osteoarthritis. This has worsened since the previous
study. No acute fracture.

## 2019-07-11 IMAGING — RF DG HIP (WITH PELVIS) OPERATIVE*R*
1 series · 2 of 2 positions shown · non-contrast
Comparison: Pelvis and right hip films of 02/16/2018

CLINICAL DATA: Intraoperative images from right hip replacement

EXAM:
OPERATIVE right HIP (WITH PELVIS IF PERFORMED) 2 VIEWS
TECHNIQUE: Fluoroscopic spot image(s) were submitted for interpretation
post-operatively.

[Series 1: unknown protocol · 0.20mm/px · 2 of 2 slices shown]
[im 1/2]
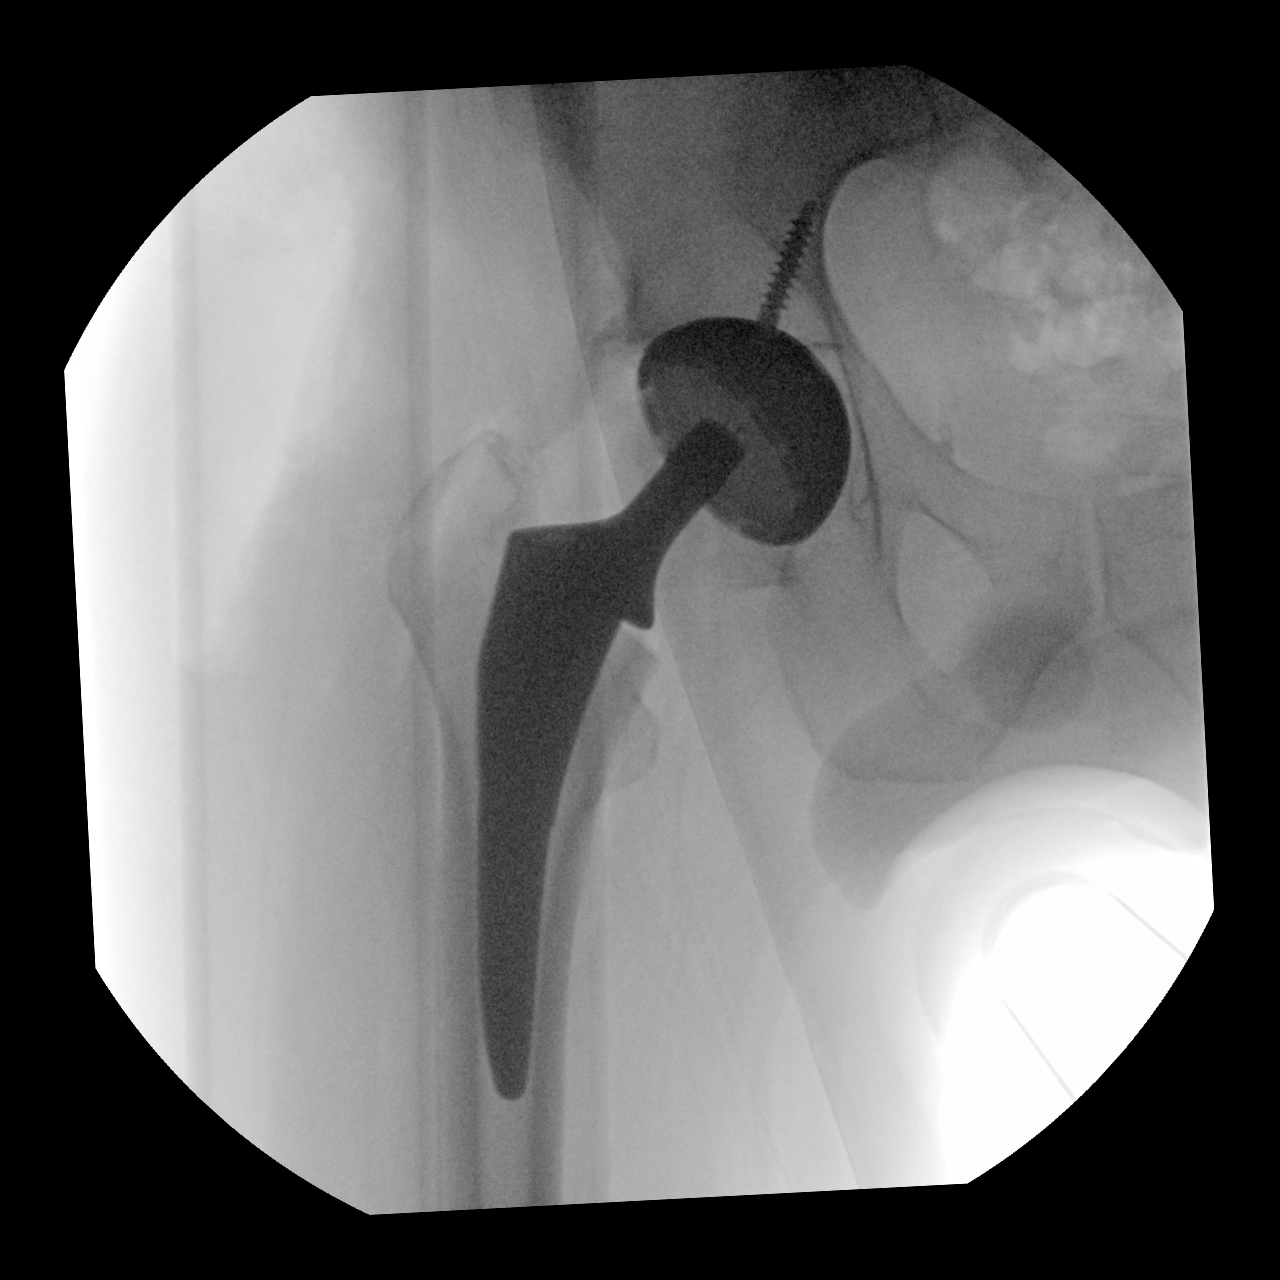
[im 2/2]
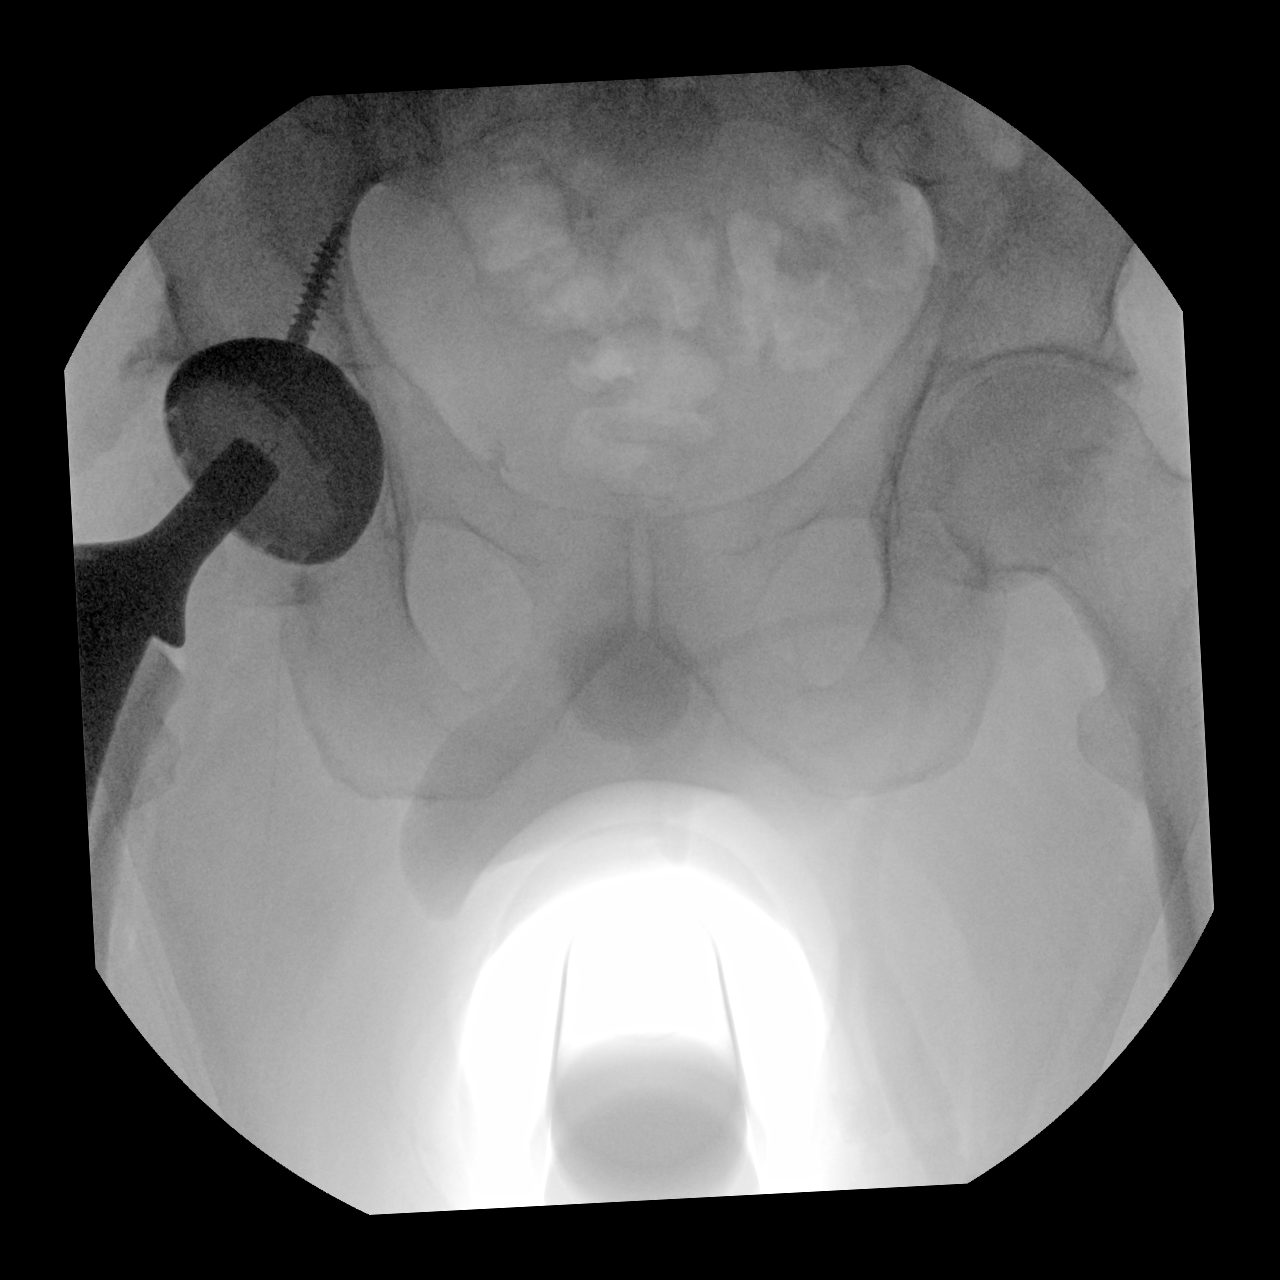

[2 of 2 positions shown; findings below may reference images not displayed]

FINDINGS: The components of the right hip replacement are in good position on
2 spot films obtained. No complicating features are seen.
IMPRESSION: Right total hip replacement components in good position. No
complicating features.

## 2019-09-24 ENCOUNTER — Encounter: Payer: Self-pay | Admitting: Internal Medicine

## 2019-09-25 ENCOUNTER — Encounter: Payer: Self-pay | Admitting: Internal Medicine

## 2021-06-29 ENCOUNTER — Other Ambulatory Visit: Payer: Self-pay

## 2021-06-29 ENCOUNTER — Ambulatory Visit: Payer: Medicare Other | Admitting: Gastroenterology

## 2021-06-29 ENCOUNTER — Encounter: Payer: Self-pay | Admitting: Gastroenterology

## 2021-06-29 VITALS — BP 160/81 | HR 59 | Temp 96.9°F | Ht 67.0 in | Wt 214.4 lb

## 2021-06-29 DIAGNOSIS — K5903 Drug induced constipation: Secondary | ICD-10-CM | POA: Diagnosis not present

## 2021-06-29 DIAGNOSIS — K5900 Constipation, unspecified: Secondary | ICD-10-CM | POA: Insufficient documentation

## 2021-06-29 DIAGNOSIS — Z86010 Personal history of colonic polyps: Secondary | ICD-10-CM

## 2021-06-29 NOTE — Progress Notes (Signed)
Primary Care Physician:  Ginger Organ  Primary Gastroenterologist:  Garfield Cornea, MD   Chief Complaint  Patient presents with   Consult    Due for TCS. Last done 2016    HPI:  Angel Lewis is a 70 y.o. male here to schedule surveillance colonoscopy. Last colonoscopy in 2016, two tubular adenomas removed.  5-year surveillance exam recommended.  Presents today to schedule his procedure. He is on suboxone and complains of intermittent constipation. He takes colace 2 daily. He may have a stool every day for a few days, then if skips two days without a BM, he will take Exlax. He takes ExLax about once per week. Previously tried daily Miralax but it was not helpful. Denies melena, brbpr. No abdominal pain. He complains of an abdominal hernia, afraid to put pressure on it, especially with bending over. Denies associated pain. No N/V. Rare heartburn. Intentional weight loss of 15 pounds but has gained 5 pounds back. Exercises on a stationary bike 15 minutes daily.     Current Outpatient Medications  Medication Sig Dispense Refill   amLODipine-benazepril (LOTREL) 10-40 MG per capsule Take 1 capsule by mouth at bedtime.      Ascorbic Acid (VITAMIN C) 1000 MG tablet Take 1,000 mg by mouth daily.     aspirin 325 MG tablet Take 325 mg by mouth daily.     buprenorphine-naloxone (SUBOXONE) 8-2 mg SUBL SL tablet Place 1 tablet under the tongue 3 (three) times daily.      carboxymethylcellul-glycerin (REFRESH OPTIVE) 0.5-0.9 % ophthalmic solution Place 1 drop into both eyes at bedtime. As needed     Cholecalciferol (VITAMIN D) 2000 units tablet Take 2,000 Units by mouth daily.     Coenzyme Q10 (CO Q-10) 100 MG CAPS Take 100 mg by mouth daily.      docusate sodium (COLACE) 100 MG capsule Take 1 capsule (100 mg total) by mouth 2 (two) times daily. (Patient taking differently: Take 200 mg by mouth daily.) 10 capsule 0   DULoxetine (CYMBALTA) 20 MG capsule Take 40 mg by mouth daily.     fluticasone  (FLONASE) 50 MCG/ACT nasal spray Place into both nostrils daily.     Garlic 8657 MG CAPS Take 1,200 mg by mouth daily.     mometasone (NASONEX) 50 MCG/ACT nasal spray Place 2 sprays into the nose daily as needed (allergies).      Multiple Vitamin (MULTIVITAMIN) capsule Take 1 capsule by mouth daily.     No current facility-administered medications for this visit.    Allergies as of 06/29/2021   (No Known Allergies)    Past Medical History:  Diagnosis Date   Arthritis    Cancer ([place])    prostate   Hypertension    Spondylolysis    all over     Past Surgical History:  Procedure Laterality Date   CATARACT EXTRACTION W/PHACO  07/26/2011   Procedure: CATARACT EXTRACTION PHACO AND INTRAOCULAR LENS PLACEMENT (IOC);  Surgeon: Tonny Branch;  Location: [facility];  Service: Ophthalmology;  Laterality: Right;  CDE:10.88   COLONOSCOPY     COLONOSCOPY N/A 11/04/2014   [facility]: Diverticulosis, 2 tubular adenomas removed.   KNEE ARTHROSCOPY  07/13/1983   left   PROSTATECTOMY  02/23/2005 [name]   radical prostatectomy   TONSILLECTOMY     as child   TOTAL HIP ARTHROPLASTY Right 05/23/2018   Procedure: RIGHT TOTAL HIP ARTHROPLASTY ANTERIOR APPROACH;  Surgeon: Paralee Cancel, MD;  Location: [facility];  Service: Orthopedics;  Laterality: Right;  70 mins    Family History  Problem Relation Age of Onset   Anesthesia problems Neg Hx    Hypotension Neg Hx    Malignant hyperthermia Neg Hx    Pseudochol deficiency Neg Hx    Colon cancer Neg Hx     Social History   Socioeconomic History   Marital status: Married    Spouse name: Not on file   Number of children: Not on file   Years of education: Not on file   Highest education level: Not on file  Occupational History   Not on file  Tobacco Use   Smoking status: Every Day    Packs/day: 0.25    Years: 30.00    Pack years: 7.50    Types: Cigarettes   Smokeless tobacco: Never  Vaping Use   Vaping Use: Never used  Substance and Sexual  Activity   Alcohol use: Yes    Comment: rarely   Drug use: No   Sexual activity: Yes    Birth control/protection: None  Other Topics Concern   Not on file  Social History Narrative   Not on file   Social Determinants of Health   Financial Resource Strain: Not on file  Food Insecurity: Not on file  Transportation Needs: Not on file  Physical Activity: Not on file  Stress: Not on file  Social Connections: Not on file  Intimate Partner Violence: Not on file      ROS:  General: Negative for anorexia, weight loss, fever, chills, fatigue, weakness. Eyes: Negative for vision changes.  ENT: Negative for hoarseness, difficulty swallowing , nasal congestion. CV: Negative for chest pain, angina, palpitations, dyspnea on exertion, peripheral edema.  Respiratory: Negative for dyspnea at rest, dyspnea on exertion, cough, sputum, wheezing.  GI: See history of present illness. GU:  Negative for dysuria, hematuria, urinary incontinence, urinary frequency, nocturnal urination.  MS: chronic back and joint pain Derm: Negative for rash or itching.  Neuro: Negative for weakness, abnormal sensation, seizure, frequent headaches, memory loss, confusion.  Psych: Negative for anxiety, depression, suicidal ideation, hallucinations.  Endo: Negative for unusual weight change.  Heme: Negative for bruising or bleeding. Allergy: Negative for rash or hives.    Physical Examination:  BP (!) 160/81    Pulse (!) 59    Temp (!) 96.9 F (36.1 C)    Ht 5\' 7"  (1.702 m)    Wt 214 lb 6.4 oz (97.3 kg)    BMI 33.58 kg/m    General: Well-nourished, well-developed in no acute distress.  Head: Normocephalic, atraumatic.   Eyes: Conjunctiva pink, no icterus. Mouth: masked Neck: Supple without thyromegaly, masses, or lymphadenopathy.  Lungs: Clear to auscultation bilaterally.  Heart: Regular rate and rhythm, no murmurs rubs or gallops.  Abdomen: Bowel sounds are normal, nontender, nondistended, no  hepatosplenomegaly or masses, no abdominal bruits , no rebound or guarding.  Diastasis recti. Tiny umb hernia easily reducible and nontender Rectal: not performed Extremities: No lower extremity edema. No clubbing or deformities.  Neuro: Alert and oriented x 4 , grossly normal neurologically.  Skin: Warm and dry, no rash or jaundice.   Psych: Alert and cooperative, normal mood and affect.    Imaging Studies: No results found.   Assessment:  History of adenomatous colon polyps: Due for surveillance colonoscopy at this time.  Given Suboxone use, will plan for propofol for sedation.  Chronically with intermittent constipation in the setting of opioids.  Currently using Ex-Lax once weekly.  Provided him  with Linzess 290 mcg samples today, he will use once daily the week before his procedure.  He was given extra to use between now and then to see if he would like to stay on medication chronically.   Plan: Colonoscopy with Dr. Gala Romney with propofol.  ASA 2.  I have discussed the risks, alternatives, benefits with regards to but not limited to the risk of reaction to medication, bleeding, infection, perforation and the patient is agreeable to proceed. Written consent to be obtained. Linzess 27mcg daily at least the week before his colonoscopy to augment bowel prep. If he chooses to stay on Linzess he can call for RX.

## 2021-06-29 NOTE — Patient Instructions (Addendum)
Colonoscopy to be scheduled. See separate instructions.  Samples of Linzess provided for constipation. SAVE 7 CAPSULES TO USE THE WEEK BEFORE YOUR COLONOSCOPY. You can use the additional Linzess samples between now and then to see if it is something you would like to stay on chronically for constipation. You can take one daily, but if you have several loose stools after taking one, you can skip the following day. Some people will use it 2-3 times a week. Linzess is not habit forming and can be used daily if needed.

## 2021-07-23 ENCOUNTER — Encounter: Payer: Self-pay | Admitting: *Deleted

## 2021-07-23 ENCOUNTER — Telehealth: Payer: Self-pay | Admitting: *Deleted

## 2021-07-23 MED ORDER — PEG 3350-KCL-NA BICARB-NACL 420 G PO SOLR: ORAL | 0 refills | Status: AC

## 2021-07-23 NOTE — Telephone Encounter (Signed)
Called pt. Spoke with spouse Butch Penny. Patient scheduled for TCS with Dr. Gala Romney, ASA 2 PROPOFOL on 1/23 at 9:00am. Aware patient needs to take the linzess samples 265mcg QD 1 week prior. Instructions mailed. Rx for prep sent to pharmacy.  PA submitted via [place]. Auth# [id number], DOS: Aug 03, 2021 - Nov 01, 2021

## 2021-08-03 ENCOUNTER — Ambulatory Visit (HOSPITAL_COMMUNITY): Payer: Medicare Other | Admitting: Anesthesiology

## 2021-08-03 ENCOUNTER — Encounter (HOSPITAL_COMMUNITY): Payer: Self-pay | Admitting: Internal Medicine

## 2021-08-03 ENCOUNTER — Other Ambulatory Visit: Payer: Self-pay

## 2021-08-03 ENCOUNTER — Ambulatory Visit (HOSPITAL_COMMUNITY)
Admission: RE | Admit: 2021-08-03 | Discharge: 2021-08-03 | Disposition: A | Discharge status: Home / Self Care | Payer: Medicare Other | Attending: Internal Medicine | Admitting: Internal Medicine

## 2021-08-03 ENCOUNTER — Encounter (HOSPITAL_COMMUNITY)
Admission: RE | Disposition: A | Discharge status: Home / Self Care | Payer: Self-pay | Source: Home / Self Care | Attending: Internal Medicine

## 2021-08-03 DIAGNOSIS — Z86010 Personal history of colonic polyps: Secondary | ICD-10-CM

## 2021-08-03 DIAGNOSIS — I10 Essential (primary) hypertension: Secondary | ICD-10-CM | POA: Diagnosis not present

## 2021-08-03 DIAGNOSIS — Z1211 Encounter for screening for malignant neoplasm of colon: Secondary | ICD-10-CM | POA: Diagnosis present

## 2021-08-03 DIAGNOSIS — K5730 Diverticulosis of large intestine without perforation or abscess without bleeding: Secondary | ICD-10-CM | POA: Insufficient documentation

## 2021-08-03 DIAGNOSIS — F17210 Nicotine dependence, cigarettes, uncomplicated: Secondary | ICD-10-CM | POA: Diagnosis not present

## 2021-08-03 DIAGNOSIS — D125 Benign neoplasm of sigmoid colon: Secondary | ICD-10-CM | POA: Diagnosis not present

## 2021-08-03 DIAGNOSIS — Q438 Other specified congenital malformations of intestine: Secondary | ICD-10-CM | POA: Diagnosis not present

## 2021-08-03 HISTORY — PX: POLYPECTOMY: SHX5525

## 2021-08-03 HISTORY — PX: COLONOSCOPY WITH PROPOFOL: SHX5780

## 2021-08-03 SURGERY — COLONOSCOPY WITH PROPOFOL
Anesthesia: General

## 2021-08-03 MED ORDER — PROPOFOL 500 MG/50ML IV EMUL
INTRAVENOUS | Status: DC | PRN
  Administered 2021-08-03: 150 ug/kg/min via INTRAVENOUS

## 2021-08-03 MED ORDER — PROPOFOL 10 MG/ML IV BOLUS
INTRAVENOUS | Status: DC | PRN
Start: 2021-08-03 — End: 2021-08-03
  Administered 2021-08-03: 100 mg via INTRAVENOUS

## 2021-08-03 MED ORDER — LACTATED RINGERS IV SOLN: INTRAVENOUS | Status: DC

## 2021-08-03 MED ORDER — LIDOCAINE HCL (CARDIAC) PF 100 MG/5ML IV SOSY
PREFILLED_SYRINGE | INTRAVENOUS | Status: DC | PRN
  Administered 2021-08-03: 50 mg via INTRAVENOUS

## 2021-08-03 MED ORDER — EPHEDRINE SULFATE (PRESSORS) 50 MG/ML IJ SOLN
INTRAMUSCULAR | Status: DC | PRN
  Administered 2021-08-03: 10 mg via INTRAVENOUS

## 2021-08-03 NOTE — Discharge Instructions (Addendum)
°  Colonoscopy Discharge Instructions  Read the instructions outlined below and refer to this sheet in the next few weeks. These discharge instructions provide you with general information on caring for yourself after you leave the hospital. Your doctor may also give you specific instructions. While your treatment has been planned according to the most current medical practices available, unavoidable complications occasionally occur. If you have any problems or questions after discharge, call Dr. Gala Romney at [phone]. ACTIVITY You may resume your regular activity, but move at a slower pace for the next 24 hours.  Take frequent rest periods for the next 24 hours.  Walking will help get rid of the air and reduce the bloated feeling in your belly (abdomen).  No driving for 24 hours (because of the medicine (anesthesia) used during the test).   Do not sign any important legal documents or operate any machinery for 24 hours (because of the anesthesia used during the test).  NUTRITION Drink plenty of fluids.  You may resume your normal diet as instructed by your doctor.  Begin with a light meal and progress to your normal diet. Heavy or fried foods are harder to digest and may make you feel sick to your stomach (nauseated).  Avoid alcoholic beverages for 24 hours or as instructed.  MEDICATIONS You may resume your normal medications unless your doctor tells you otherwise.  WHAT YOU CAN EXPECT TODAY Some feelings of bloating in the abdomen.  Passage of more gas than usual.  Spotting of blood in your stool or on the toilet paper.  IF YOU HAD POLYPS REMOVED DURING THE COLONOSCOPY: No aspirin products for 7 days or as instructed.  No alcohol for 7 days or as instructed.  Eat a soft diet for the next 24 hours.  FINDING OUT THE RESULTS OF YOUR TEST Not all test results are available during your visit. If your test results are not back during the visit, make an appointment with your caregiver to find out the  results. Do not assume everything is normal if you have not heard from your caregiver or the medical facility. It is important for you to follow up on all of your test results.  SEEK IMMEDIATE MEDICAL ATTENTION IF: You have more than a spotting of blood in your stool.  Your belly is swollen (abdominal distention).  You are nauseated or vomiting.  You have a temperature over 101.  You have abdominal pain or discomfort that is severe or gets worse throughout the day.    Diverticulosis and polyp information provided  1 polyp removed in your colon today  Further recommendations to follow pending review of pathology report  At patient request, called Butch Penny at [phone]  -reviewed findings and recommendations

## 2021-08-03 NOTE — Transfer of Care (Signed)
Immediate Anesthesia Transfer of Care Note  Patient: Angel Lewis  Procedure(s) Performed: COLONOSCOPY WITH PROPOFOL POLYPECTOMY  Patient Location: PACU  Anesthesia Type:General  Level of Consciousness: awake, alert  and oriented  Airway & Oxygen Therapy: Patient Spontanous Breathing  Post-op Assessment: Report given to RN, Post -op Vital signs reviewed and stable and Patient moving all extremities X 4  Post vital signs: Reviewed and stable  Last Vitals:  Vitals Value Taken Time  BP 101/51 08/03/21 0925  Temp 36.6 C 08/03/21 0925  Pulse    Resp 14 08/03/21 0925  SpO2 93 % 08/03/21 0925    Last Pain:  Vitals:   08/03/21 0925  TempSrc: Axillary  PainSc: 0-No pain         Complications: No notable events documented.

## 2021-08-03 NOTE — Anesthesia Preprocedure Evaluation (Signed)
Anesthesia Evaluation  Patient identified by MRN, date of birth, ID band Patient awake    Reviewed: Allergy & Precautions, H&P , NPO status , Patient's Chart, lab work & pertinent test results, reviewed documented beta blocker date and time   Airway Mallampati: II  TM Distance: >3 FB Neck ROM: full    Dental no notable dental hx.    Pulmonary neg pulmonary ROS, Current Smoker and Patient abstained from smoking.,    Pulmonary exam normal breath sounds clear to auscultation       Cardiovascular Exercise Tolerance: Good hypertension, negative cardio ROS   Rhythm:regular Rate:Normal     Neuro/Psych negative neurological ROS  negative psych ROS   GI/Hepatic negative GI ROS, Neg liver ROS,   Endo/Other  negative endocrine ROS  Renal/GU negative Renal ROS  negative genitourinary   Musculoskeletal   Abdominal   Peds  Hematology negative hematology ROS (+)   Anesthesia Other Findings   Reproductive/Obstetrics negative OB ROS                             Anesthesia Physical Anesthesia Plan  ASA: 2  Anesthesia Plan: General   Post-op Pain Management:    Induction:   PONV Risk Score and Plan: Propofol infusion  Airway Management Planned:   Additional Equipment:   Intra-op Plan:   Post-operative Plan:   Informed Consent: I have reviewed the patients History and Physical, chart, labs and discussed the procedure including the risks, benefits and alternatives for the proposed anesthesia with the patient or authorized representative who has indicated his/her understanding and acceptance.     Dental Advisory Given  Plan Discussed with: CRNA  Anesthesia Plan Comments:         Anesthesia Quick Evaluation  

## 2021-08-03 NOTE — Op Note (Signed)
[facility] Patient Name: Angel Lewis Procedure Date: 08/03/2021 8:39 AM MRN: [id number] Date of Birth: [birth year] Attending MD: Norvel Richards , MD CSN: [id number] Age: 71 Admit Type: Outpatient Procedure:                Colonoscopy Indications:              High risk colon cancer surveillance: Personal                            history of colonic polyps Providers:                Norvel Richards, MD, Lambert Mody,                            Kristine L. Risa Grill, Technician Referring MD:              Medicines:                Propofol per Anesthesia Complications:            No immediate complications. Estimated Blood Loss:     Estimated blood loss was minimal. Procedure:                Pre-Anesthesia Assessment:                           - Prior to the procedure, a History and Physical                            was performed, and patient medications and                            allergies were reviewed. The patient's tolerance of                            previous anesthesia was also reviewed. The risks                            and benefits of the procedure and the sedation                            options and risks were discussed with the patient.                            All questions were answered, and informed consent                            was obtained. Prior Anticoagulants: The patient has                            taken no previous anticoagulant or antiplatelet                            agents. ASA Grade Assessment: II - A patient with  mild systemic disease. After reviewing the risks                            and benefits, the patient was deemed in                            satisfactory condition to undergo the procedure.                           After obtaining informed consent, the colonoscope                            was passed under direct vision. Throughout the                            procedure,  the patient's blood pressure, pulse, and                            oxygen saturations were monitored continuously. The                            [id number]) scope was introduced through the                            anus and advanced to the the cecum, identified by                            appendiceal orifice and ileocecal valve. The                            colonoscopy was performed without difficulty. The                            patient tolerated the procedure well. The quality                            of the bowel preparation was adequate. The                            ileocecal valve, appendiceal orifice, and rectum                            were photographed. Scope In: 9:00:00 AM Scope Out: 9:20:36 AM Scope Withdrawal Time: 0 hours 14 minutes 25 seconds  Total Procedure Duration: 0 hours 20 minutes 36 seconds  Findings:      The perianal and digital rectal examinations were normal. Redundant       colon. External abdominal pressure required to reach the cecum. There       was quite a bit of granular fiber/seed material throughout the colon       which required copious washing and lavage to gain adequate visualization.      Scattered medium-mouthed diverticula were found in the sigmoid colon and       descending colon.      A 7 mm polyp was found in  the sigmoid colon. The polyp was       semi-pedunculated. The polyp was removed with a cold snare. Resection       and retrieval were complete. Estimated blood loss was minimal.      The exam was otherwise without abnormality on direct and retroflexion       views. Impression:               - Diverticulosis in the sigmoid colon and in the                            descending colon. Redundant colon.                           - One 7 mm polyp in the sigmoid colon, removed with                            a cold snare. Resected and retrieved.                           - The examination was otherwise normal on direct                             and retroflexion views. Moderate Sedation:      Moderate (conscious) sedation was personally administered by an       anesthesia professional. The following parameters were monitored: oxygen       saturation, heart rate, blood pressure, respiratory rate, EKG, adequacy       of pulmonary ventilation, and response to care. Recommendation:           - Patient has a contact number available for                            emergencies. The signs and symptoms of potential                            delayed complications were discussed with the                            patient. Return to normal activities tomorrow.                            Written discharge instructions were provided to the                            patient.                           - Advance diet as tolerated.                           - Continue present medications.                           - Repeat colonoscopy date to be determined after  pending pathology results are reviewed for                            surveillance.                           - Return to GI office (date not yet determined). Procedure Code(s):        --- Professional ---                           [id number], Colonoscopy, flexible; with removal of                            tumor(s), polyp(s), or other lesion(s) by snare                            technique Diagnosis Code(s):        --- Professional ---                           Z86.010, Personal history of colonic polyps                           K63.5, Polyp of colon                           K57.30, Diverticulosis of large intestine without                            perforation or abscess without bleeding CPT copyright 2019 [employer]. All rights reserved. The codes documented in this report are preliminary and upon coder review may  be revised to meet current compliance requirements. [name]. [name], MD Norvel Richards,  MD 08/03/2021 9:30:45 AM This report has been signed electronically. Number of Addenda: 0

## 2021-08-03 NOTE — Anesthesia Postprocedure Evaluation (Signed)
Anesthesia Post Note  Patient: Angel Lewis  Procedure(s) Performed: COLONOSCOPY WITH PROPOFOL POLYPECTOMY  Patient location during evaluation: Phase II Anesthesia Type: General Level of consciousness: awake Pain management: pain level controlled Vital Signs Assessment: post-procedure vital signs reviewed and stable Respiratory status: spontaneous breathing and respiratory function stable Cardiovascular status: blood pressure returned to baseline and stable Postop Assessment: no headache and no apparent nausea or vomiting Anesthetic complications: no Comments: Late entry   No notable events documented.   Last Vitals:  Vitals:   08/03/21 0925 08/03/21 0930  BP: (!) 101/51 124/61  Pulse:    Resp: 14 15  Temp: 36.6 C   SpO2: 93% 93%    Last Pain:  Vitals:   08/03/21 0925  TempSrc: Axillary  PainSc: 0-No pain                 Louann Sjogren

## 2021-08-03 NOTE — H&P (Signed)
@LOGO @   Primary Care Physician:  Ginger Organ Primary Gastroenterologist:  Dr. Gala Romney  Pre-Procedure History & Physical: HPI:  Angel Lewis is a 71 y.o. male here for surveillance colonoscopy.  History of multiple colonic adenomas removed from the left colon 2016.  Here for surveillance examination per plan.  No bowel symptoms at this time.  Past Medical History:  Diagnosis Date   Arthritis    Cancer [facility])    prostate   Hypertension    Spondylolysis    all over     Past Surgical History:  Procedure Laterality Date   CATARACT EXTRACTION W/PHACO  07/26/2011   Procedure: CATARACT EXTRACTION PHACO AND INTRAOCULAR LENS PLACEMENT (IOC);  Surgeon: Tonny Branch;  Location: [facility];  Service: Ophthalmology;  Laterality: Right;  CDE:10.88   COLONOSCOPY     COLONOSCOPY N/A 11/04/2014   [name]: Diverticulosis, 2 tubular adenomas removed.   KNEE ARTHROSCOPY  07/13/1983   left   PROSTATECTOMY  02/23/2005 [name]   radical prostatectomy   TONSILLECTOMY     as child   TOTAL HIP ARTHROPLASTY Right 05/23/2018   Procedure: RIGHT TOTAL HIP ARTHROPLASTY ANTERIOR APPROACH;  Surgeon: Paralee Cancel, MD;  Location: [facility];  Service: Orthopedics;  Laterality: Right;  70 mins    Prior to Admission medications   Medication Sig Start Date End Date Taking? Authorizing Provider  amLODipine-benazepril (LOTREL) 10-40 MG per capsule Take 1 capsule by mouth at bedtime.    Yes [provider]  Ascorbic Acid (VITAMIN C) 1000 MG tablet Take 1,000 mg by mouth daily.   Yes [provider]  aspirin 325 MG tablet Take 325 mg by mouth daily.   Yes [provider]  buprenorphine-naloxone (SUBOXONE) 8-2 mg SUBL SL tablet Place 1 tablet under the tongue 3 (three) times daily.    Yes [provider]  carboxymethylcellul-glycerin (REFRESH OPTIVE) 0.5-0.9 % ophthalmic solution Place 1 drop into both eyes 3 (three) times daily as needed for dry eyes.   Yes [provider]  Coenzyme Q10 (CO Q-10) 100 MG CAPS Take 100 mg by mouth daily.    Yes [provider]  docusate sodium (COLACE) 100 MG capsule Take 1 capsule (100 mg total) by mouth 2 (two) times daily. Patient taking differently: Take 200 mg by mouth daily. 05/23/18  Yes Babish, Rodman Key, PA-C  DULoxetine (CYMBALTA) 30 MG capsule Take 30 mg by mouth 2 (two) times daily.   Yes [provider]  fluticasone (FLONASE) 50 MCG/ACT nasal spray Place 2 sprays into both nostrils daily.   Yes [provider]  Multiple Vitamin (MULTIVITAMIN) capsule Take 1 capsule by mouth daily.   Yes [provider]  polyethylene glycol-electrolytes (NULYTELY) 420 g solution As directed 07/23/21  Yes Sherre Wooton, [name], MD    Allergies as of 07/23/2021   (No Known Allergies)    Family History  Problem Relation Age of Onset   Anesthesia problems Neg Hx    Hypotension Neg Hx    Malignant hyperthermia Neg Hx    Pseudochol deficiency Neg Hx    Colon cancer Neg Hx     Social History   Socioeconomic History   Marital status: Married    Spouse name: Not on file   Number of children: Not on file   Years of education: Not on file   Highest education level: Not on file  Occupational History   Not on file  Tobacco Use   Smoking status: Every Day  Packs/day: 0.25    Years: 30.00    Pack years: 7.50    Types: Cigarettes   Smokeless tobacco: Never  Vaping Use   Vaping Use: Never used  Substance and Sexual Activity   Alcohol use: Yes    Comment: rarely   Drug use: No   Sexual activity: Yes    Birth control/protection: None  Other Topics Concern   Not on file  Social History Narrative   Not on file   Social Determinants of Health   Financial Resource Strain: Not on file  Food Insecurity: Not on file  Transportation Needs: Not on file  Physical Activity: Not on file  Stress: Not on file  Social Connections: Not on file  Intimate Partner Violence: Not on file    Review of  Systems: See HPI, otherwise negative ROS  Physical Exam: BP (!) 181/70    Pulse 79    Temp 98.9 F (37.2 C) (Oral)    Resp 17    Ht 5\' 7"  (1.702 m)    Wt 93 kg    SpO2 96%    BMI 32.11 kg/m  General:   Alert,  Well-developed, well-nourished, pleasant and cooperative in NAD Mouth:  No deformity or lesions. Neck:  Supple; no masses or thyromegaly. No significant cervical adenopathy. Lungs:  Clear throughout to auscultation.   No wheezes, crackles, or rhonchi. No acute distress. Heart:  Regular rate and rhythm; no murmurs, clicks, rubs,  or gallops. Abdomen: Non-distended, normal bowel sounds.  Soft and nontender without appreciable mass or hepatosplenomegaly.  Pulses:  Normal pulses noted. Extremities:  Without clubbing or edema.  Impression/Plan: 71 year old gentleman with a history of colonic adenomas; here for surveillance colonoscopy per plan.  The risks, benefits, limitations, alternatives and imponderables have been reviewed with the patient. Questions have been answered. All parties are agreeable.       Notice: This dictation was prepared with Dragon dictation along with smaller phrase technology. Any transcriptional errors that result from this process are unintentional and may not be corrected upon review.

## 2021-08-05 LAB — SURGICAL PATHOLOGY

## 2021-08-06 ENCOUNTER — Encounter: Payer: Self-pay | Admitting: Internal Medicine

## 2021-08-06 ENCOUNTER — Encounter (HOSPITAL_COMMUNITY): Payer: Self-pay | Admitting: Internal Medicine

## 2021-10-15 ENCOUNTER — Ambulatory Visit: Payer: Medicare Other | Admitting: Orthopedic Surgery

## 2022-01-14 DIAGNOSIS — Z0000 Encounter for general adult medical examination without abnormal findings: Secondary | ICD-10-CM | POA: Diagnosis not present

## 2022-01-14 DIAGNOSIS — Z13220 Encounter for screening for lipoid disorders: Secondary | ICD-10-CM | POA: Diagnosis not present

## 2022-01-14 DIAGNOSIS — Z0001 Encounter for general adult medical examination with abnormal findings: Secondary | ICD-10-CM | POA: Diagnosis not present

## 2022-01-14 DIAGNOSIS — I10 Essential (primary) hypertension: Secondary | ICD-10-CM | POA: Diagnosis not present

## 2022-04-10 DIAGNOSIS — G8929 Other chronic pain: Secondary | ICD-10-CM | POA: Diagnosis not present

## 2022-04-10 DIAGNOSIS — M459 Ankylosing spondylitis of unspecified sites in spine: Secondary | ICD-10-CM | POA: Diagnosis not present

## 2022-06-07 DIAGNOSIS — F514 Sleep terrors [night terrors]: Secondary | ICD-10-CM | POA: Diagnosis not present

## 2022-06-17 DIAGNOSIS — M1712 Unilateral primary osteoarthritis, left knee: Secondary | ICD-10-CM | POA: Diagnosis not present

## 2022-06-17 DIAGNOSIS — M1711 Unilateral primary osteoarthritis, right knee: Secondary | ICD-10-CM | POA: Diagnosis not present

## 2022-08-02 DIAGNOSIS — I10 Essential (primary) hypertension: Secondary | ICD-10-CM | POA: Diagnosis not present

## 2022-08-02 DIAGNOSIS — G8929 Other chronic pain: Secondary | ICD-10-CM | POA: Diagnosis not present

## 2022-08-02 DIAGNOSIS — M459 Ankylosing spondylitis of unspecified sites in spine: Secondary | ICD-10-CM | POA: Diagnosis not present

## 2022-08-31 ENCOUNTER — Ambulatory Visit: Payer: Medicare Other

## 2022-09-02 DIAGNOSIS — I10 Essential (primary) hypertension: Secondary | ICD-10-CM | POA: Diagnosis not present

## 2022-09-02 DIAGNOSIS — G8929 Other chronic pain: Secondary | ICD-10-CM | POA: Diagnosis not present

## 2022-09-02 DIAGNOSIS — F514 Sleep terrors [night terrors]: Secondary | ICD-10-CM | POA: Diagnosis not present

## 2022-09-02 DIAGNOSIS — M459 Ankylosing spondylitis of unspecified sites in spine: Secondary | ICD-10-CM | POA: Diagnosis not present

## 2022-09-02 DIAGNOSIS — M549 Dorsalgia, unspecified: Secondary | ICD-10-CM | POA: Diagnosis not present

## 2022-09-09 ENCOUNTER — Encounter: Payer: Self-pay | Admitting: Radiology

## 2022-09-30 DIAGNOSIS — I10 Essential (primary) hypertension: Secondary | ICD-10-CM | POA: Diagnosis not present

## 2022-09-30 DIAGNOSIS — M459 Ankylosing spondylitis of unspecified sites in spine: Secondary | ICD-10-CM | POA: Diagnosis not present

## 2022-09-30 DIAGNOSIS — G8929 Other chronic pain: Secondary | ICD-10-CM | POA: Diagnosis not present

## 2022-09-30 DIAGNOSIS — F514 Sleep terrors [night terrors]: Secondary | ICD-10-CM | POA: Diagnosis not present

## 2022-10-29 DIAGNOSIS — F514 Sleep terrors [night terrors]: Secondary | ICD-10-CM | POA: Diagnosis not present

## 2022-10-29 DIAGNOSIS — G8929 Other chronic pain: Secondary | ICD-10-CM | POA: Diagnosis not present

## 2022-10-29 DIAGNOSIS — M459 Ankylosing spondylitis of unspecified sites in spine: Secondary | ICD-10-CM | POA: Diagnosis not present

## 2022-10-29 DIAGNOSIS — I10 Essential (primary) hypertension: Secondary | ICD-10-CM | POA: Diagnosis not present

## 2022-11-04 DIAGNOSIS — K439 Ventral hernia without obstruction or gangrene: Secondary | ICD-10-CM | POA: Diagnosis not present

## 2022-11-04 DIAGNOSIS — I10 Essential (primary) hypertension: Secondary | ICD-10-CM | POA: Diagnosis not present

## 2022-11-15 DIAGNOSIS — M459 Ankylosing spondylitis of unspecified sites in spine: Secondary | ICD-10-CM | POA: Diagnosis not present

## 2022-11-15 DIAGNOSIS — G8929 Other chronic pain: Secondary | ICD-10-CM | POA: Diagnosis not present

## 2022-11-15 DIAGNOSIS — F514 Sleep terrors [night terrors]: Secondary | ICD-10-CM | POA: Diagnosis not present

## 2022-12-01 ENCOUNTER — Ambulatory Visit: Payer: Medicare Other | Admitting: Family Medicine

## 2022-12-09 DIAGNOSIS — G8929 Other chronic pain: Secondary | ICD-10-CM | POA: Diagnosis not present

## 2022-12-09 DIAGNOSIS — I10 Essential (primary) hypertension: Secondary | ICD-10-CM | POA: Diagnosis not present

## 2022-12-09 DIAGNOSIS — F514 Sleep terrors [night terrors]: Secondary | ICD-10-CM | POA: Diagnosis not present

## 2024-02-20 ENCOUNTER — Other Ambulatory Visit (HOSPITAL_COMMUNITY): Payer: Self-pay

## 2024-02-20 DIAGNOSIS — F17200 Nicotine dependence, unspecified, uncomplicated: Secondary | ICD-10-CM

## 2024-03-16 ENCOUNTER — Ambulatory Visit (HOSPITAL_COMMUNITY)
Admission: RE | Admit: 2024-03-16 | Discharge: 2024-03-16 | Disposition: A | Discharge status: Home / Self Care | Source: Ambulatory Visit

## 2024-03-16 DIAGNOSIS — J439 Emphysema, unspecified: Secondary | ICD-10-CM | POA: Insufficient documentation

## 2024-03-16 DIAGNOSIS — Z122 Encounter for screening for malignant neoplasm of respiratory organs: Secondary | ICD-10-CM | POA: Insufficient documentation

## 2024-03-16 DIAGNOSIS — F17210 Nicotine dependence, cigarettes, uncomplicated: Secondary | ICD-10-CM | POA: Diagnosis not present

## 2024-03-16 DIAGNOSIS — I700 Atherosclerosis of aorta: Secondary | ICD-10-CM | POA: Insufficient documentation

## 2024-03-16 DIAGNOSIS — F17200 Nicotine dependence, unspecified, uncomplicated: Secondary | ICD-10-CM
